# Patient Record
Sex: Female | Born: 1955 | Race: White | Hispanic: No | Marital: Married | Smoking: Former smoker
Health system: Southern US, Community
[De-identification: ages and names within clinical notes are randomized; demographics above are authoritative.]

## PROBLEM LIST (undated history)

## (undated) DIAGNOSIS — I4892 Unspecified atrial flutter: Secondary | ICD-10-CM

## (undated) DIAGNOSIS — K589 Irritable bowel syndrome without diarrhea: Secondary | ICD-10-CM

## (undated) DIAGNOSIS — E785 Hyperlipidemia, unspecified: Secondary | ICD-10-CM

## (undated) DIAGNOSIS — R011 Cardiac murmur, unspecified: Secondary | ICD-10-CM

## (undated) DIAGNOSIS — F329 Major depressive disorder, single episode, unspecified: Secondary | ICD-10-CM

## (undated) DIAGNOSIS — F419 Anxiety disorder, unspecified: Secondary | ICD-10-CM

## (undated) DIAGNOSIS — A498 Other bacterial infections of unspecified site: Secondary | ICD-10-CM

## (undated) DIAGNOSIS — I5189 Other ill-defined heart diseases: Secondary | ICD-10-CM

## (undated) DIAGNOSIS — K5792 Diverticulitis of intestine, part unspecified, without perforation or abscess without bleeding: Secondary | ICD-10-CM

## (undated) DIAGNOSIS — E7800 Pure hypercholesterolemia, unspecified: Secondary | ICD-10-CM

## (undated) DIAGNOSIS — K5790 Diverticulosis of intestine, part unspecified, without perforation or abscess without bleeding: Secondary | ICD-10-CM

## (undated) DIAGNOSIS — F32A Depression, unspecified: Secondary | ICD-10-CM

## (undated) DIAGNOSIS — M1990 Unspecified osteoarthritis, unspecified site: Secondary | ICD-10-CM

## (undated) DIAGNOSIS — K219 Gastro-esophageal reflux disease without esophagitis: Secondary | ICD-10-CM

## (undated) HISTORY — DX: Major depressive disorder, single episode, unspecified: F32.9

## (undated) HISTORY — PX: BREAST LUMPECTOMY: SHX2

## (undated) HISTORY — DX: Diverticulosis of intestine, part unspecified, without perforation or abscess without bleeding: K57.90

## (undated) HISTORY — DX: Hyperlipidemia, unspecified: E78.5

## (undated) HISTORY — DX: Depression, unspecified: F32.A

## (undated) HISTORY — DX: Cardiac murmur, unspecified: R01.1

## (undated) HISTORY — DX: Pure hypercholesterolemia, unspecified: E78.00

## (undated) HISTORY — PX: TUBAL LIGATION: SHX77

## (undated) HISTORY — DX: Gastro-esophageal reflux disease without esophagitis: K21.9

## (undated) HISTORY — DX: Unspecified atrial flutter: I48.92

## (undated) HISTORY — DX: Other ill-defined heart diseases: I51.89

## (undated) HISTORY — DX: Irritable bowel syndrome, unspecified: K58.9

## (undated) HISTORY — DX: Diverticulitis of intestine, part unspecified, without perforation or abscess without bleeding: K57.92

## (undated) HISTORY — PX: APPENDECTOMY: SHX54

## (undated) HISTORY — DX: Anxiety disorder, unspecified: F41.9

## (undated) HISTORY — DX: Other bacterial infections of unspecified site: A49.8

## (undated) HISTORY — DX: Unspecified osteoarthritis, unspecified site: M19.90

## (undated) HISTORY — PX: COLONOSCOPY: SHX174

---

## 1999-04-04 ENCOUNTER — Other Ambulatory Visit: Admission: RE | Admit: 1999-04-04 | Discharge: 1999-04-04 | Payer: Self-pay | Admitting: Obstetrics and Gynecology

## 2000-04-13 ENCOUNTER — Other Ambulatory Visit: Admission: RE | Admit: 2000-04-13 | Discharge: 2000-04-13 | Payer: Self-pay | Admitting: Obstetrics and Gynecology

## 2001-04-14 ENCOUNTER — Other Ambulatory Visit: Admission: RE | Admit: 2001-04-14 | Discharge: 2001-04-14 | Payer: Self-pay | Admitting: Obstetrics and Gynecology

## 2001-09-02 ENCOUNTER — Ambulatory Visit (HOSPITAL_BASED_OUTPATIENT_CLINIC_OR_DEPARTMENT_OTHER): Admission: RE | Admit: 2001-09-02 | Discharge: 2001-09-02 | Payer: Self-pay | Admitting: Surgery

## 2001-09-02 ENCOUNTER — Encounter (INDEPENDENT_AMBULATORY_CARE_PROVIDER_SITE_OTHER): Payer: Self-pay | Admitting: Specialist

## 2002-07-04 ENCOUNTER — Other Ambulatory Visit: Admission: RE | Admit: 2002-07-04 | Discharge: 2002-07-04 | Payer: Self-pay | Admitting: Obstetrics and Gynecology

## 2003-11-19 ENCOUNTER — Other Ambulatory Visit: Admission: RE | Admit: 2003-11-19 | Discharge: 2003-11-19 | Payer: Self-pay | Admitting: Obstetrics and Gynecology

## 2004-05-12 ENCOUNTER — Encounter: Admission: RE | Admit: 2004-05-12 | Discharge: 2004-05-12 | Payer: Self-pay | Admitting: Family Medicine

## 2004-06-09 ENCOUNTER — Encounter: Admission: RE | Admit: 2004-06-09 | Discharge: 2004-06-09 | Payer: Self-pay | Admitting: Family Medicine

## 2004-06-24 ENCOUNTER — Ambulatory Visit: Payer: Self-pay | Admitting: Gastroenterology

## 2004-06-25 ENCOUNTER — Ambulatory Visit (HOSPITAL_COMMUNITY): Admission: RE | Admit: 2004-06-25 | Discharge: 2004-06-25 | Payer: Self-pay | Admitting: Gastroenterology

## 2004-06-25 ENCOUNTER — Ambulatory Visit: Payer: Self-pay | Admitting: Gastroenterology

## 2004-07-10 ENCOUNTER — Ambulatory Visit: Payer: Self-pay | Admitting: Gastroenterology

## 2004-07-15 ENCOUNTER — Ambulatory Visit: Payer: Self-pay | Admitting: Cardiology

## 2004-08-20 ENCOUNTER — Ambulatory Visit: Payer: Self-pay | Admitting: Gastroenterology

## 2004-08-20 ENCOUNTER — Encounter (INDEPENDENT_AMBULATORY_CARE_PROVIDER_SITE_OTHER): Payer: Self-pay | Admitting: Specialist

## 2004-10-29 ENCOUNTER — Ambulatory Visit: Payer: Self-pay | Admitting: Gastroenterology

## 2004-12-25 ENCOUNTER — Other Ambulatory Visit: Admission: RE | Admit: 2004-12-25 | Discharge: 2004-12-25 | Payer: Self-pay | Admitting: Obstetrics and Gynecology

## 2007-01-17 ENCOUNTER — Encounter: Admission: RE | Admit: 2007-01-17 | Discharge: 2007-01-17 | Payer: Self-pay | Admitting: Family Medicine

## 2008-02-23 ENCOUNTER — Encounter: Admission: RE | Admit: 2008-02-23 | Discharge: 2008-02-23 | Payer: Self-pay | Admitting: Obstetrics and Gynecology

## 2008-08-31 ENCOUNTER — Encounter: Admission: RE | Admit: 2008-08-31 | Discharge: 2008-08-31 | Payer: Self-pay | Admitting: Family Medicine

## 2010-07-02 ENCOUNTER — Encounter (INDEPENDENT_AMBULATORY_CARE_PROVIDER_SITE_OTHER): Payer: Self-pay | Admitting: *Deleted

## 2010-07-08 NOTE — Letter (Signed)
Summary: Pre Visit Letter Revised  [facility]  [address] [place], [place] [address]   Phone: [phone]  Fax: [phone]        07/02/2010 MRN: [id number] [facility] [address] [place], [place]  [address]             Procedure Date:  08-18-10           Recall Colon----Dr. Jarold Motto   Welcome to the Gastroenterology Division at [facility].    You are scheduled to see a nurse for your pre-procedure visit on 08-04-10 at 1:30p.m. on the 3rd floor at [facility], [address]  We ask that you try to arrive at our office 15 minutes prior to your appointment time to allow for check-in.  Please take a minute to review the attached form.  If you answer "Yes" to one or more of the questions on the first page, we ask that you call the person listed at your earliest opportunity.  If you answer "No" to all of the questions, please complete the rest of the form and bring it to your appointment.    Your nurse visit will consist of discussing your medical and surgical history, your immediate family medical history, and your medications.   If you are unable to list all of your medications on the form, please bring the medication bottles to your appointment and we will list them.  We will need to be aware of both prescribed and over the counter drugs.  We will need to know exact dosage information as well.    Please be prepared to read and sign documents such as consent forms, a financial agreement, and acknowledgement forms.  If necessary, and with your consent, a friend or relative is welcome to sit-in on the nurse visit with you.  Please bring your insurance card so that we may make a copy of it.  If your insurance requires a referral to see a specialist, please bring your referral form from your primary care physician.  No co-pay is required for this nurse visit.     If you cannot keep your appointment, please call [phone] to cancel or reschedule  prior to your appointment date.  This allows [place] the opportunity to schedule an appointment for another patient in need of care.    Thank you for choosing [facility] for your medical needs.  We appreciate the opportunity to care for you.  Please visit [place] at our website  to learn more about our practice.  Sincerely, The Gastroenterology Division

## 2010-08-04 ENCOUNTER — Ambulatory Visit (AMBULATORY_SURGERY_CENTER): Payer: BC Managed Care – PPO | Admitting: *Deleted

## 2010-08-04 VITALS — Ht 68.0 in | Wt 165.6 lb

## 2010-08-04 DIAGNOSIS — Z1211 Encounter for screening for malignant neoplasm of colon: Secondary | ICD-10-CM

## 2010-08-04 DIAGNOSIS — Z800 Family history of malignant neoplasm of digestive organs: Secondary | ICD-10-CM

## 2010-08-04 MED ORDER — PEG-KCL-NACL-NASULF-NA ASC-C 100 G PO SOLR: 1.0000 | Freq: Once | ORAL | Status: AC

## 2010-08-04 NOTE — Patient Instructions (Signed)
Please pick up prep at pharmacy Please review prep instructions 

## 2010-08-06 ENCOUNTER — Encounter: Payer: Self-pay | Admitting: Gastroenterology

## 2010-08-15 ENCOUNTER — Encounter: Payer: Self-pay | Admitting: Gastroenterology

## 2010-08-18 ENCOUNTER — Ambulatory Visit (AMBULATORY_SURGERY_CENTER): Payer: BC Managed Care – PPO | Admitting: Gastroenterology

## 2010-08-18 ENCOUNTER — Encounter: Payer: Self-pay | Admitting: Gastroenterology

## 2010-08-18 DIAGNOSIS — Z139 Encounter for screening, unspecified: Secondary | ICD-10-CM

## 2010-08-18 DIAGNOSIS — K5730 Diverticulosis of large intestine without perforation or abscess without bleeding: Secondary | ICD-10-CM | POA: Insufficient documentation

## 2010-08-18 DIAGNOSIS — K5732 Diverticulitis of large intestine without perforation or abscess without bleeding: Secondary | ICD-10-CM

## 2010-08-18 DIAGNOSIS — Z1211 Encounter for screening for malignant neoplasm of colon: Secondary | ICD-10-CM

## 2010-08-18 DIAGNOSIS — D126 Benign neoplasm of colon, unspecified: Secondary | ICD-10-CM | POA: Insufficient documentation

## 2010-08-18 DIAGNOSIS — Z800 Family history of malignant neoplasm of digestive organs: Secondary | ICD-10-CM

## 2010-08-18 DIAGNOSIS — K648 Other hemorrhoids: Secondary | ICD-10-CM

## 2010-08-18 MED ORDER — CIPROFLOXACIN HCL 500 MG PO TABS: 500.0000 mg | ORAL_TABLET | Freq: Two times a day (BID) | ORAL | Status: AC

## 2010-08-18 MED ORDER — SODIUM CHLORIDE 0.9 % IV SOLN: 500.0000 mL | INTRAVENOUS | Status: DC

## 2010-08-18 NOTE — Patient Instructions (Signed)
REPEAT COLONOSCOPY IN 5 YEARS- WE WILL MAIL YOU A LETTER TO REMIND YOU  HANDOUTS GIVEN ON DIVERTICULOSIS AND LITIS, HIGH FIBER DIET, HEMORRHOIDS  START 500MG  TWICE A DAY FOR 7 DAYS TO TREAT YOUR DIVERTICULITIS, SENT TO [facility] AS REQUESTED  CAN ALSO TRY METAMUCIL OR BENEFIBER DAILY OVER THE COUNTER

## 2010-08-19 ENCOUNTER — Telehealth: Payer: Self-pay | Admitting: *Deleted

## 2010-08-19 NOTE — Telephone Encounter (Signed)

## 2010-08-22 NOTE — Op Note (Signed)
[place]  Patient:    Sheri Sullivan, Sheri Sullivan Visit Number: [id number] MRN: [id number]          Service Type: DSU Location: [facility] Attending Physician:  Charlton Haws Dictated by:   Currie Paris, M.D. Proc. Date: 09/02/01 Admit Date:  09/02/2001   CC:         Desma Maxim, M.D.  Malachi Pro. Ambrose Mantle, M.D.   Operative Report  CCS# [id number]  PREOPERATIVE DIAGNOSIS:  Right breast mass.  POSTOPERATIVE DIAGNOSIS:  Right breast mass.  OPERATION PERFORMED:  Excision of right breast mass.  SURGEON:  Currie Paris, M.D.  ANESTHESIA:  MAC.  INDICATIONS FOR PROCEDURE:  The patient has a right breast mass and irregularity, density in the upper outer quadrant of her right breast almost into the axilla.  It was not visible on mammography and ultrasound and had really been stable for a while but because of the patients concern, we elected to go ahead and remove this.  DESCRIPTION OF PROCEDURE:  The patient was seen in the holding area and had no further questions.  She was taken to the operating room and positioned with her arm above her head which was the position that this was most easily palpable in.  The mass was identified by the patient and myself as a very small soft mass almost out of the breast into the axillary tail area and the area was marked.  She was kept in that position and then IV sedation given. The breast was prepped and draped and anesthetized with 1% Xylocaine.  A curvilinear incision directly over this area was made and this area was fairly mobile and soft and I had to excise a little bit of subcutaneous fatty tissue until I could get down to the breast and with my finger palpating, I was able to trap the nodule and grasp some adjacent tissue with an Allis, free up a little bit of breast tissue with the cautery and then get the mass itself grasped with another Allis and excise it with a little bit of normal  breast tissue around it.  I think that the mass is completely removed and covered by normal fibrous breast tissue.  It measured perhaps 1 cm in size and I thought completely excised.  Further palpation revealed no additional abnormalities. Additional local had been infiltrated as we worked and I closed in layers with 3-0 Vicryl followed by 4-0 Monocryl subcuticular with Steri-Strips.  The patient tolerated the procedure well and there were no operative complications.  All counts were correct. Dictated by:   Currie Paris, M.D. Attending Physician:  Charlton Haws DD:  09/02/01 TD:  09/03/01 Job: [id number]

## 2010-09-03 ENCOUNTER — Encounter: Payer: Self-pay | Admitting: Gastroenterology

## 2011-02-04 ENCOUNTER — Other Ambulatory Visit: Payer: Self-pay | Admitting: Family Medicine

## 2011-02-06 ENCOUNTER — Ambulatory Visit
Admission: RE | Admit: 2011-02-06 | Discharge: 2011-02-06 | Disposition: A | Discharge status: Home / Self Care | Payer: BC Managed Care – PPO | Source: Ambulatory Visit | Attending: Family Medicine | Admitting: Family Medicine

## 2011-02-06 MED ORDER — IOHEXOL 300 MG/ML  SOLN
100.0000 mL | Freq: Once | INTRAMUSCULAR | Status: AC | PRN
  Administered 2011-02-06: 100 mL via INTRAVENOUS

## 2011-03-03 ENCOUNTER — Other Ambulatory Visit: Payer: Self-pay | Admitting: Obstetrics and Gynecology

## 2013-06-09 ENCOUNTER — Other Ambulatory Visit: Payer: Self-pay | Admitting: *Deleted

## 2013-06-09 DIAGNOSIS — R002 Palpitations: Secondary | ICD-10-CM

## 2013-06-13 ENCOUNTER — Encounter (INDEPENDENT_AMBULATORY_CARE_PROVIDER_SITE_OTHER): Payer: PRIVATE HEALTH INSURANCE

## 2013-06-13 ENCOUNTER — Encounter: Payer: Self-pay | Admitting: *Deleted

## 2013-06-13 ENCOUNTER — Other Ambulatory Visit: Payer: Self-pay | Admitting: Obstetrics and Gynecology

## 2013-06-13 DIAGNOSIS — R002 Palpitations: Secondary | ICD-10-CM

## 2013-06-13 NOTE — Progress Notes (Signed)
Patient ID: Sheri Sullivan, female   DOB: 20-Apr-1955, 58 y.o.   MRN: [id number] E-Cardio verite 30 day cardiac event monitor applied to patient.

## 2013-07-24 ENCOUNTER — Telehealth: Payer: Self-pay | Admitting: *Deleted

## 2013-07-24 NOTE — Telephone Encounter (Signed)
Left message with front desk attendant about monitor results. (she tells me dr is in pt room) Informed her that event monitor showed paroxysmal flutter w/ increased rate, per Dr. Johnsie Cancel. I will give back to monitor room to fax over copy.

## 2013-12-20 ENCOUNTER — Other Ambulatory Visit (HOSPITAL_COMMUNITY): Payer: Self-pay | Admitting: Family Medicine

## 2013-12-20 DIAGNOSIS — I471 Supraventricular tachycardia: Secondary | ICD-10-CM

## 2013-12-21 ENCOUNTER — Ambulatory Visit (HOSPITAL_COMMUNITY): Payer: PRIVATE HEALTH INSURANCE | Attending: Family Medicine

## 2013-12-21 DIAGNOSIS — Z87891 Personal history of nicotine dependence: Secondary | ICD-10-CM | POA: Insufficient documentation

## 2013-12-21 DIAGNOSIS — I4891 Unspecified atrial fibrillation: Secondary | ICD-10-CM | POA: Insufficient documentation

## 2013-12-21 DIAGNOSIS — E7800 Pure hypercholesterolemia, unspecified: Secondary | ICD-10-CM | POA: Insufficient documentation

## 2013-12-21 DIAGNOSIS — I471 Supraventricular tachycardia: Secondary | ICD-10-CM

## 2013-12-21 NOTE — Progress Notes (Signed)
2D Echo completed. 12/21/2013

## 2014-01-09 ENCOUNTER — Encounter: Payer: Self-pay | Admitting: Cardiovascular Disease

## 2014-01-09 ENCOUNTER — Ambulatory Visit (INDEPENDENT_AMBULATORY_CARE_PROVIDER_SITE_OTHER): Payer: PRIVATE HEALTH INSURANCE | Admitting: Cardiovascular Disease

## 2014-01-09 VITALS — BP 90/70 | HR 73 | Ht 68.0 in | Wt 157.0 lb

## 2014-01-09 DIAGNOSIS — I471 Supraventricular tachycardia: Secondary | ICD-10-CM

## 2014-01-09 NOTE — Progress Notes (Signed)
Sheri Sullivan Date of Birth  12-Aug-1955       [facility] Office [address], [place], [place]  [address]   [place], [place]  [address] [place]   Fax  [phone]     Fax [phone]  Problem List: 1. Supraventricular tachycardia vs. paroxysmal atrial flutter with rapid ventricular rate 2. Attending   History of Present Illness:  Sheri Sullivan is a 58 yo who is referred for episodes of PSVT / atrial flutter with RVR/. These started a year ago. Lots of stress starting last yest - buy at church [facility]).  Daughter got married. Dr. Brigitte Pulse started metoprolol  But dose is limited by hypotension.   The episodes last about 30 seconds.  Not associated with exercise.  Slight shortness or breath.  No diaphoresis. Does not really slow her down much.    Exercises - walks 2 miles a day.     Current Outpatient Prescriptions on File Prior to Visit  Medication Sig Dispense Refill  . Calcium Carbonate-Vitamin D (CALCIUM + D PO) Take 1 tablet by mouth 2 (two) times daily.        Marland Kitchen FIBER PO Take 2 tablets by mouth daily.        . IBUPROFEN PO Take by mouth as needed.        . multivitamin (THERAGRAN) per tablet Take 1 tablet by mouth daily.        . Omega-3 Fatty Acids (FISH OIL) 1000 MG CAPS Take 1,000 mg by mouth 2 (two) times daily.         No current facility-administered medications on file prior to visit.    Allergies  Allergen Reactions  . Codeine     Makes pt nervous and shaky    Past Medical History  Diagnosis Date  . Arthritis   . Hyperlipidemia   . Osteoporosis     osteopenia    Past Surgical History  Procedure Laterality Date  . Colonoscopy    . Appendectomy    . Tubal ligation    . Breast lumpectomy      R breast, benign lump    History  Smoking status  . Former Smoker  Smokeless tobacco  . Not on file    History  Alcohol Use  . 4.8 oz/week  . 8  Glasses of wine per week    Family History  Problem Relation Age of Onset  . Colon cancer Mother     Reviw of Systems:  Reviewed in the HPI.  All other systems are negative.  Physical Exam: Blood pressure 90/70, pulse 73, height 5\' 8"  (1.727 m), weight 157 lb (71.215 kg). Wt Readings from Last 3 Encounters:  01/09/14 157 lb (71.215 kg)  08/04/10 165 lb 9.6 oz (75.116 kg)     General: Well developed, well nourished, in no acute distress.  Head: Normocephalic, atraumatic, sclera non-icteric, mucus membranes are moist,   Neck: Supple. Carotids are 2 + without bruits. No JVD   Lungs: Clear   Heart: RR, very soft murmur, no click heart  Abdomen: Soft, non-tender, non-distended with normal bowel sounds.  Msk:  Strength and tone are normal   Extremities: No clubbing or cyanosis. No edema.  Distal pedal pulses are 2+ and equal    Neuro: CN II - XII intact.  Alert and oriented X 3.   Psych:  Normal  ECG: Oct. 6, 2015:  NSR at 73.  Normal   Assessment / Plan:

## 2014-01-09 NOTE — Patient Instructions (Addendum)
You  probably have supra-ventricular tachycardia. Remember to try a Valsalva maneuver and the diving reflex if you have prolonged episodes  Your physician wants you to follow-up in: 6 months with Dr. Acie Fredrickson.  You will receive a reminder letter in the mail two months in advance. If you don't receive a letter, please call our office to schedule the follow-up appointment.

## 2014-01-09 NOTE — Assessment & Plan Note (Signed)
Sheri Sullivan presents for further evaluation of episodes of tachycardia. She wore an event monitor and the strips recorded. The consistent with supraventricular tachycardia. I cannot completely rule out atrial flutter with a rapid ventricular rate.  He has been started on metoprolol 25 mg twice a day which has helped with the SVT but her blood pressure is low and she doesn't tolerate any higher doses of metoprolol.  We discussed the 3 W. to terminate SVT- Valsalva maneuver, diving reflex, and carotid sinus massage  At this point her episodes only last for about 30 seconds and really not much of a nuisance. We discussed the fact that they really are not going to be any significant risk associated with these.  We discussed RF ablation - she doe not need to consider that yet.

## 2014-01-12 ENCOUNTER — Encounter: Payer: Self-pay | Admitting: Cardiovascular Disease

## 2014-06-01 ENCOUNTER — Encounter: Payer: Self-pay | Admitting: Gastroenterology

## 2014-06-06 ENCOUNTER — Encounter: Payer: Self-pay | Admitting: Gastroenterology

## 2014-06-06 ENCOUNTER — Other Ambulatory Visit (INDEPENDENT_AMBULATORY_CARE_PROVIDER_SITE_OTHER): Payer: PRIVATE HEALTH INSURANCE

## 2014-06-06 ENCOUNTER — Other Ambulatory Visit: Payer: Self-pay

## 2014-06-06 ENCOUNTER — Ambulatory Visit (INDEPENDENT_AMBULATORY_CARE_PROVIDER_SITE_OTHER): Payer: PRIVATE HEALTH INSURANCE | Admitting: Gastroenterology

## 2014-06-06 VITALS — BP 105/60 | HR 59 | Ht 68.0 in | Wt 165.2 lb

## 2014-06-06 DIAGNOSIS — R1033 Periumbilical pain: Secondary | ICD-10-CM

## 2014-06-06 DIAGNOSIS — K921 Melena: Secondary | ICD-10-CM

## 2014-06-06 DIAGNOSIS — K5901 Slow transit constipation: Secondary | ICD-10-CM

## 2014-06-06 DIAGNOSIS — D509 Iron deficiency anemia, unspecified: Secondary | ICD-10-CM

## 2014-06-06 LAB — COMPREHENSIVE METABOLIC PANEL
ALT: 16 U/L (ref 0–35)
AST: 20 U/L (ref 0–37)
Albumin: 4.4 g/dL (ref 3.5–5.2)
Alkaline Phosphatase: 51 U/L (ref 39–117)
BUN: 15 mg/dL (ref 6–23)
CALCIUM: 10.3 mg/dL (ref 8.4–10.5)
CHLORIDE: 105 meq/L (ref 96–112)
CO2: 28 meq/L (ref 19–32)
CREATININE: 0.83 mg/dL (ref 0.40–1.20)
GFR: 75.19 mL/min (ref >60.00)
Glucose, Bld: 96 mg/dL (ref 70–99)
Potassium: 4.1 mEq/L (ref 3.5–5.1)
Sodium: 140 mEq/L (ref 135–145)
Total Bilirubin: 0.6 mg/dL (ref 0.2–1.2)
Total Protein: 7.2 g/dL (ref 6.0–8.3)

## 2014-06-06 LAB — IBC PANEL
IRON: 117 ug/dL (ref 42–145)
SATURATION RATIOS: 31.2 % (ref 20.0–50.0)
TRANSFERRIN: 268 mg/dL (ref 212.0–360.0)

## 2014-06-06 LAB — VITAMIN B12: Vitamin B-12: 532 pg/mL (ref 211–911)

## 2014-06-06 LAB — FERRITIN: Ferritin: 12.2 ng/mL (ref 10.0–291.0)

## 2014-06-06 LAB — FOLATE: Folate: >24.6 ng/mL (ref >5.9)

## 2014-06-06 MED ORDER — IRON 325 (65 FE) MG PO TABS: 325.0000 mg | ORAL_TABLET | Freq: Two times a day (BID) | ORAL | Status: DC

## 2014-06-06 MED ORDER — CIPROFLOXACIN HCL 500 MG PO TABS: 500.0000 mg | ORAL_TABLET | Freq: Two times a day (BID) | ORAL | Status: DC

## 2014-06-06 NOTE — Progress Notes (Signed)
History of Present Illness: This is a 59 year old female referred by Dr. Kelton Pillar for evaluation constipation and rectal bleeding. She underwent colonoscopy by Dr. Sharlett Iles in May 2012. Diverticulosis with focal diverticulitis, internal hemorrhoids and a small hyperplastic polyp were noted. The colon was tortuous and redundant. Patient has had worsening constipation over the past year and she now uses a fiber supplement and MiraLAX on a daily basis without complete relief of symptoms. In February she had 2 weeks of bright red blood per rectum with bowel movements that subsided after a course of Preparation H suppositories. She was recently evaluated by her PCP with hemorrhoids noted on rectal exam. No bleeding noted. Her hemoglobin was in the low normal range at 12.5. Prior hemoglobin was 14.3. She has been fatigued.   Allergies  Allergen Reactions  . Codeine     Makes pt nervous and shaky   Outpatient Prescriptions Prior to Visit  Medication Sig Dispense Refill  . aspirin 81 MG tablet Take 81 mg by mouth daily.    . Calcium Carbonate-Vitamin D (CALCIUM + D PO) Take 1 tablet by mouth 2 (two) times daily.      Marland Kitchen FIBER PO Take 2 tablets by mouth daily.      . IBUPROFEN PO Take by mouth as needed.      . multivitamin (THERAGRAN) per tablet Take 1 tablet by mouth daily.      . Omega-3 Fatty Acids (FISH OIL) 1000 MG CAPS Take 1,000 mg by mouth 2 (two) times daily.      . metoprolol tartrate (LOPRESSOR) 25 MG tablet Take 12.5 mg by mouth daily.     No facility-administered medications prior to visit.   Past Medical History  Diagnosis Date  . Arthritis   . Hyperlipidemia   . Osteoporosis     osteopenia  . Diverticulitis   . Diverticulosis   . IBS (irritable bowel syndrome)   . Depression   . Paroxysmal atrial flutter    Past Surgical History  Procedure Laterality Date  . Colonoscopy    . Appendectomy    . Tubal ligation    . Breast lumpectomy      R breast, benign lump    History   Social History  . Marital Status: Married    Spouse Name: N/A  . Number of Children: N/A  . Years of Education: N/A   Social History Main Topics  . Smoking status: Former Research scientist (life sciences)  . Smokeless tobacco: Not on file  . Alcohol Use: 4.8 oz/week    8 Glasses of wine per week  . Drug Use: No  . Sexual Activity: Not on file   Other Topics Concern  . None   Social History Narrative   Family History  Problem Relation Age of Onset  . Colon cancer Mother      Review of Systems: Pertinent positive and negative review of systems were noted in the above HPI section. All other review of systems were otherwise negative.   Physical Exam: General: Well developed, well nourished, no acute distress Head: Normocephalic and atraumatic Eyes:  sclerae anicteric, EOMI Ears: Normal auditory acuity Mouth: No deformity or lesions Neck: Supple, no masses or thyromegaly Lungs: Clear throughout to auscultation Heart: Regular rate and rhythm; no murmurs, rubs or bruits Abdomen: Soft, mild periumbilical abdominal tenderness and non distended. No masses, hepatosplenomegaly or hernias noted. Normal Bowel sounds Rectal: deferred with recent exam by PCP showing hemorrhoids, no blood Musculoskeletal: Symmetrical with no gross deformities  Skin: No lesions on visible extremities Pulses:  Normal pulses noted Extremities: No clubbing, cyanosis, edema or deformities noted Neurological: Alert oriented x 4, grossly nonfocal Cervical Nodes:  No significant cervical adenopathy Inguinal Nodes: No significant inguinal adenopathy Psychological:  Alert and cooperative. Normal mood and affect  Assessment and Recommendations:  1. Hematochezia. Likely secondary to hemorrhoids. If bleeding recurs will consider evaluation with colonoscopy. Preparation H suppositories as needed.  2. Constipation and periumbilical abdominal pain. Increase MiraLAX to twice daily. Possible diverticulitis and will treat with a  7 day course of Cipro. If symptoms persist consider abdominal/pelvic CT. REV in 3-4 weeks.  3. Decrease in hemoglobin. Obtain iron, TIBC B12 and folate.  4. Family history of colon cancer. Higher risk screening colonoscopy due in May 2017.  cc: Kelton Pillar, MD [address] [place], [place] [address]

## 2014-06-06 NOTE — Patient Instructions (Addendum)
Your physician has requested that you go to the basement for lab work before leaving today.  Increase your Miralax to twice daily.  We have sent the following medications to your pharmacy for you to pick up at your convenience: Cipro.  Your follow up appointment with Dr. Fuller Plan is scheduled for 06/27/14 at 10:30am. If you need to reschedule or cancel please call [phone].

## 2014-06-20 ENCOUNTER — Other Ambulatory Visit: Payer: PRIVATE HEALTH INSURANCE

## 2014-06-20 DIAGNOSIS — D509 Iron deficiency anemia, unspecified: Secondary | ICD-10-CM

## 2014-06-20 LAB — HEMOCCULT SLIDES (X 3 CARDS)
Fecal Occult Blood: NEGATIVE
OCCULT 1: NEGATIVE
OCCULT 2: NEGATIVE
OCCULT 3: NEGATIVE
OCCULT 4: NEGATIVE
OCCULT 5: NEGATIVE

## 2014-06-27 ENCOUNTER — Ambulatory Visit (INDEPENDENT_AMBULATORY_CARE_PROVIDER_SITE_OTHER): Payer: PRIVATE HEALTH INSURANCE | Admitting: Gastroenterology

## 2014-06-27 ENCOUNTER — Encounter: Payer: Self-pay | Admitting: Gastroenterology

## 2014-06-27 VITALS — BP 112/64 | HR 60 | Ht 68.0 in | Wt 164.2 lb

## 2014-06-27 DIAGNOSIS — R109 Unspecified abdominal pain: Secondary | ICD-10-CM

## 2014-06-27 DIAGNOSIS — K5900 Constipation, unspecified: Secondary | ICD-10-CM

## 2014-06-27 MED ORDER — LINACLOTIDE 145 MCG PO CAPS: 145.0000 ug | ORAL_CAPSULE | Freq: Every day | ORAL | Status: DC

## 2014-06-27 NOTE — Patient Instructions (Signed)
We have sent the following medications to your pharmacy for you to pick up at your convenience:Linzess.  You have been scheduled for a CT scan of the abdomen and pelvis at [facility] ([address]---this is in the same building as [facility]).   You are scheduled on 07/09/14 at 1:30pm. You should arrive 15 minutes prior to your appointment time for registration. Please follow the written instructions below on the day of your exam:  WARNING: IF YOU ARE ALLERGIC TO IODINE/X-RAY DYE, PLEASE NOTIFY RADIOLOGY IMMEDIATELY AT [phone]! YOU WILL BE GIVEN A 13 HOUR PREMEDICATION PREP.  1) Do not eat or drink anything after 9:30am (4 hours prior to your test) 2) You have been given 2 bottles of oral contrast to drink. The solution may taste better if refrigerated, but do NOT add ice or any other liquid to this solution. Shake well before drinking.    Drink 1 bottle of contrast @ 11:30am (2 hours prior to your exam)  Drink 1 bottle of contrast @ 12:30pm (1 hour prior to your exam)  You may take any medications as prescribed with a small amount of water except for the following: Metformin, Glucophage, Glucovance, Avandamet, Riomet, Fortamet, Actoplus Met, Janumet, Glumetza or Metaglip. The above medications must be held the day of the exam AND 48 hours after the exam.  The purpose of you drinking the oral contrast is to aid in the visualization of your intestinal tract. The contrast solution may cause some diarrhea. Before your exam is started, you will be given a small amount of fluid to drink. Depending on your individual set of symptoms, you may also receive an intravenous injection of x-ray contrast/dye. Plan on being at [facility] for 30 minutes or long, depending on the type of exam you are having performed.  This test typically takes 30-45 minutes to complete.  If you have any questions regarding your exam or if you need to reschedule, you may call the CT department  at [phone] between the hours of 8:00 am and 5:00 pm, Monday-Friday.  ________________________________________________________________________ Your follow up appointment with Dr. Fuller Plan is on 08/10/14 at 9:15am.   Thank you for choosing me and [facility].  Pricilla Riffle. Dagoberto Ligas., MD., [facility]

## 2014-06-27 NOTE — Progress Notes (Signed)
    History of Present Illness: This is a 59 year old female returning for follow-up of right-sided abdominal pain and constipation. She completed a course of antibiotics and states that for 2-3 days after finishing antibiotics her symptoms were improved however they have returned. She notes it MiraLAX twice a day is not adequately addressing her constipation. Her right side abdominal pain is intermittent and often relieved with a bowel movement.  Current Medications, Allergies, Past Medical History, Past Surgical History, Family History and Social History were reviewed in [employer] record.  Physical Exam: General: Well developed , well nourished, no acute distress Head: Normocephalic and atraumatic Eyes:  sclerae anicteric, EOMI Ears: Normal auditory acuity Mouth: No deformity or lesions Lungs: Clear throughout to auscultation Heart: Regular rate and rhythm; no murmurs, rubs or bruits Abdomen: Soft, mild right mid abdominal tenderness and non distended. No masses, hepatosplenomegaly or hernias noted. Normal Bowel sounds Musculoskeletal: Symmetrical with no gross deformities  Pulses:  Normal pulses noted Extremities: No clubbing, cyanosis, edema or deformities noted Neurological: Alert oriented x 4, grossly nonfocal Psychological:  Alert and cooperative. Normal mood and affect  Assessment and Recommendations:   1. Right-sided abdominal pain and constipation. I suspect this is IBS-C however need to exclude other disorders. Schedule abdominal/pelvic CT. Trial of Linzess 145 g daily. Return office visit in 6 weeks.

## 2014-07-09 ENCOUNTER — Ambulatory Visit (INDEPENDENT_AMBULATORY_CARE_PROVIDER_SITE_OTHER)
Admission: RE | Admit: 2014-07-09 | Discharge: 2014-07-09 | Disposition: A | Discharge status: Home / Self Care | Payer: PRIVATE HEALTH INSURANCE | Source: Ambulatory Visit | Attending: Gastroenterology | Admitting: Gastroenterology

## 2014-07-09 DIAGNOSIS — K5900 Constipation, unspecified: Secondary | ICD-10-CM | POA: Diagnosis not present

## 2014-07-09 DIAGNOSIS — R109 Unspecified abdominal pain: Secondary | ICD-10-CM | POA: Diagnosis not present

## 2014-07-09 MED ORDER — IOHEXOL 300 MG/ML  SOLN
100.0000 mL | Freq: Once | INTRAMUSCULAR | Status: AC | PRN
  Administered 2014-07-09: 100 mL via INTRAVENOUS

## 2014-07-16 ENCOUNTER — Ambulatory Visit: Payer: PRIVATE HEALTH INSURANCE | Admitting: Cardiovascular Disease

## 2014-07-26 ENCOUNTER — Encounter: Payer: Self-pay | Admitting: Gastroenterology

## 2014-08-03 ENCOUNTER — Encounter: Payer: Self-pay | Admitting: Cardiovascular Disease

## 2014-08-03 ENCOUNTER — Ambulatory Visit (INDEPENDENT_AMBULATORY_CARE_PROVIDER_SITE_OTHER): Payer: PRIVATE HEALTH INSURANCE | Admitting: Cardiovascular Disease

## 2014-08-03 VITALS — BP 102/60 | HR 62 | Ht 68.0 in | Wt 166.6 lb

## 2014-08-03 DIAGNOSIS — I471 Supraventricular tachycardia, unspecified: Secondary | ICD-10-CM

## 2014-08-03 MED ORDER — METOPROLOL SUCCINATE ER 25 MG PO TB24: 25.0000 mg | ORAL_TABLET | Freq: Every day | ORAL | Status: DC

## 2014-08-03 NOTE — Progress Notes (Signed)
Cardiology Office Note   Date:  08/03/2014   ID:  PICCOLA ARICO, DOB [birth year], MRN [id number]  PCP:  Mayra Neer, MD  Cardiologist:   Thayer Headings, MD   No chief complaint on file.  1. Supraventricular tachycardia vs. paroxysmal atrial flutter with rapid ventricular rate 2.    History of Present Illness:  Crossett is a 59 yo who is referred for episodes of PSVT / atrial flutter with RVR/. These started a year ago. Lots of stress starting last yest - buy at church [facility]). Daughter got married. Dr. Brigitte Pulse started metoprolol But dose is limited by hypotension.   The episodes last about 30 seconds. Not associated with exercise. Slight shortness or breath. No diaphoresis. Does not really slow her down much.   Exercises - walks 2 miles a day.    August 03, 2014: : SHAKIYLA KOOK is a 59 y.o. female who presents for palpitations She still has palpitations.   Does the valsalva.  Occasionally puts ice to her face.  Still has frequent episodes of SVT.     Past Medical History  Diagnosis Date  . Arthritis   . Hyperlipidemia   . Osteoporosis     osteopenia  . Diverticulitis   . Diverticulosis   . IBS (irritable bowel syndrome)   . Depression   . Paroxysmal atrial flutter     Past Surgical History  Procedure Laterality Date  . Colonoscopy    . Appendectomy    . Tubal ligation    . Breast lumpectomy      R breast, benign lump     Current Outpatient Prescriptions  Medication Sig Dispense Refill  . aspirin 81 MG tablet Take 81 mg by mouth daily.    . Calcium Carbonate-Vitamin D (CALCIUM + D PO) Take 1 tablet by mouth 2 (two) times daily.      Marland Kitchen FIBER PO Take 2 tablets by mouth 2 (two) times daily.     . IBUPROFEN PO Take by mouth as needed (AS NEEDED FOR MILD PAIN OR HEADACHE).     Marland Kitchen lactobacillus acidophilus (BACID) TABS tablet Take 1 tablet by mouth daily.    . Linaclotide (LINZESS) 145 MCG CAPS capsule Take 1 capsule (145 mcg  total) by mouth daily. 30 capsule 11  . metoprolol tartrate (LOPRESSOR) 25 MG tablet Take 12.5 mg by mouth daily. 1/2    . multivitamin (THERAGRAN) per tablet Take 1 tablet by mouth daily.      . Omega-3 Fatty Acids (FISH OIL) 1000 MG CAPS Take 1,000 mg by mouth 2 (two) times daily.      . Polyethylene Glycol 3350 (MIRALAX PO) Take by mouth daily.      No current facility-administered medications for this visit.    Allergies:   Codeine    Social History:  The patient  reports that she has quit smoking. She does not have any smokeless tobacco history on file. She reports that she drinks about 4.8 oz of alcohol per week. She reports that she does not use illicit drugs.   Family History:  The patient's family history includes Colon cancer in her mother.    ROS:  Please see the history of present illness.    Review of Systems: Constitutional:  denies fever, chills, diaphoresis, appetite change and fatigue.  HEENT: denies photophobia, eye pain, redness, hearing loss, ear pain, congestion, sore throat, rhinorrhea, sneezing, neck pain, neck stiffness and tinnitus.  Respiratory: denies SOB, DOE, cough, chest tightness, and  wheezing.  Cardiovascular: denies chest pain, palpitations and leg swelling.  Gastrointestinal: denies nausea, vomiting, abdominal pain, diarrhea, constipation, blood in stool.  Genitourinary: denies dysuria, urgency, frequency, hematuria, flank pain and difficulty urinating.  Musculoskeletal: denies  myalgias, back pain, joint swelling, arthralgias and gait problem.   Skin: denies pallor, rash and wound.  Neurological: denies dizziness, seizures, syncope, weakness, light-headedness, numbness and headaches.   Hematological: denies adenopathy, easy bruising, personal or family bleeding history.  Psychiatric/ Behavioral: denies suicidal ideation, mood changes, confusion, nervousness, sleep disturbance and agitation.       All other systems are reviewed and negative.     PHYSICAL EXAM: VS:  BP 102/60 mmHg  Pulse 62  Ht 5\' 8"  (1.727 m)  Wt 166 lb 9.6 oz (75.569 kg)  BMI 25.34 kg/m2 , BMI Body mass index is 25.34 kg/(m^2). GEN: Well nourished, well developed, in no acute distress HEENT: normal Neck: no JVD, carotid bruits, or masses Cardiac: RRR; no murmurs, rubs, or gallops,no edema  Respiratory:  clear to auscultation bilaterally, normal work of breathing GI: soft, nontender, nondistended, + BS MS: no deformity or atrophy Skin: warm and dry, no rash Neuro:  Strength and sensation are intact Psych: normal   EKG:  EKG is not ordered today. The ekg ordered today demonstrates    Recent Labs: 06/06/2014: ALT 16; BUN 15; Creatinine 0.83; Potassium 4.1; Sodium 140    Lipid Panel No results found for: CHOL, TRIG, HDL, CHOLHDL, VLDL, LDLCALC, LDLDIRECT    Wt Readings from Last 3 Encounters:  08/03/14 166 lb 9.6 oz (75.569 kg)  06/27/14 164 lb 4 oz (74.503 kg)  06/06/14 165 lb 3.2 oz (74.934 kg)      Other studies Reviewed: Additional studies/ records that were reviewed today include: . Review of the above records demonstrates:    ASSESSMENT AND PLAN:  1.  Supraventricular tachycardia: The patient presents with persistent SVT. She's found that she's able to convert her heart back to normal with a Valsalva maneuver. At this point it still remains a nuisance and has not really helped hazard. We'll try her on Toprol-XL 25 mg a day to see if this will help prevent some of the episodes. Her heart rate and blood pressure are already at the low end of normal so I do not  think that we have much room to increase the dose.   I'll see her back in the office in 6 months for follow-up visit.  Current medicines are reviewed at length with the patient today.  The patient does not have concerns regarding medicines.  The following changes have been made:  no change  Labs/ tests ordered today include:  No orders of the defined types were placed in this  encounter.     Disposition:   FU with me in 6 months      Mariaelena Cade, Wonda Cheng, MD  08/03/2014 4:53 PM    [facility] [place], [place], [place]  [address] Phone: [phone]; Fax: [phone]   [facility] ([place]  [address] [place], [place]  [address] [phone]    Fax [phone]

## 2014-08-03 NOTE — Patient Instructions (Signed)
Medication Instructions:  STOP Lopressor START Toprol XL 25 mg once daily  Labwork: None  Testing/Procedures: None  Follow-Up: Your physician wants you to follow-up in: 6 months with Dr. Acie Fredrickson.  You will receive a reminder letter in the mail two months in advance. If you don't receive a letter, please call our office to schedule the follow-up appointment.

## 2014-08-07 ENCOUNTER — Telehealth: Payer: Self-pay

## 2014-08-07 NOTE — Telephone Encounter (Signed)
Call the patient to let her know that if she takes 12.5 mg that she need to take it twice a day or if she wanted to take 25 mg she could take it once a day. She states that she would take 12.5 mg twice a day. She verbally understood

## 2014-08-07 NOTE — Telephone Encounter (Signed)
Ok thanks she will stay on the 12.5 mg

## 2014-08-07 NOTE — Telephone Encounter (Signed)
The med list at the time of the office visit indicated that she was on Metoprolol tartrate 12. 5 bid.  It appears that she was on metoprolol succinate instead.  She may continue the succinate - either 25 QD or 12. 5 BID , her choice

## 2014-08-10 ENCOUNTER — Ambulatory Visit (INDEPENDENT_AMBULATORY_CARE_PROVIDER_SITE_OTHER): Payer: PRIVATE HEALTH INSURANCE | Admitting: Gastroenterology

## 2014-08-10 ENCOUNTER — Encounter: Payer: Self-pay | Admitting: Gastroenterology

## 2014-08-10 VITALS — BP 98/58 | HR 68 | Ht 68.0 in | Wt 164.4 lb

## 2014-08-10 DIAGNOSIS — K589 Irritable bowel syndrome without diarrhea: Secondary | ICD-10-CM

## 2014-08-10 DIAGNOSIS — K5900 Constipation, unspecified: Secondary | ICD-10-CM | POA: Diagnosis not present

## 2014-08-10 DIAGNOSIS — Z800 Family history of malignant neoplasm of digestive organs: Secondary | ICD-10-CM | POA: Diagnosis not present

## 2014-08-10 NOTE — Progress Notes (Signed)
    History of Present Illness: This is a 59 year old female returning for follow-up of abdominal pain and constipation. An abd/pelvic CT on 07/09/14 was negative. Linzess is helping with IBS-C symptoms. Her abdominal pain has resolved. In addition to Banks she uses MiraLAX frequently and this has adequately addressed her constipation.  Current Medications, Allergies, Past Medical History, Past Surgical History, Family History and Social History were reviewed in [employer] record.  Physical Exam: General: Well developed , well nourished, no acute distress Head: Normocephalic and atraumatic Eyes:  sclerae anicteric, EOMI Ears: Normal auditory acuity Mouth: No deformity or lesions Lungs: Clear throughout to auscultation Heart: Regular rate and rhythm; no murmurs, rubs or bruits Abdomen: Soft, non tender and non distended. No masses, hepatosplenomegaly or hernias noted. Normal Bowel sounds Musculoskeletal: Symmetrical with no gross deformities  Pulses:  Normal pulses noted Extremities: No clubbing, cyanosis, edema or deformities noted Neurological: Alert oriented x 4, grossly nonfocal Psychological:  Alert and cooperative. Normal mood and affect  Assessment and Recommendations:  1. Right-sided abdominal pain and constipation secondary to IBS-C. Continue Linzess 145 g daily MiraLAX daily as needed. Return office visit in 1 year.  2. Family history of colon cancer in her mother. 5 year interval colonoscopy due in May 2017.  15 minutes of face-to-face time with the patient. Greater than 50% of the time was spent counseling and coordinating care.

## 2014-08-10 NOTE — Patient Instructions (Signed)
Please follow up with Dr. Fuller Plan in a year.  You are in our system for a colon recall for May 2017.  We will notify you.    I appreciate the opportunity to care for you.

## 2015-04-07 DIAGNOSIS — Z860100 Personal history of colon polyps, unspecified: Secondary | ICD-10-CM

## 2015-04-07 HISTORY — DX: Personal history of colonic polyps: Z86.010

## 2015-04-07 HISTORY — DX: Personal history of colon polyps, unspecified: Z86.0100

## 2015-06-13 ENCOUNTER — Encounter: Payer: Self-pay | Admitting: Gastroenterology

## 2015-08-09 ENCOUNTER — Telehealth: Payer: Self-pay | Admitting: Gastroenterology

## 2015-08-09 NOTE — Telephone Encounter (Signed)
OK with me.

## 2015-08-09 NOTE — Telephone Encounter (Signed)
Dr. Havery Moros would you okay the switch?

## 2015-08-11 NOTE — Telephone Encounter (Signed)
Only if okay with Dr. Fuller Plan. Please schedule for next routine office appointment. Thanks

## 2015-08-12 NOTE — Telephone Encounter (Signed)
Spoke with patient, she is driving and will call back to schedule

## 2015-08-21 ENCOUNTER — Ambulatory Visit (AMBULATORY_SURGERY_CENTER): Payer: Self-pay

## 2015-08-21 VITALS — Ht 68.0 in | Wt 173.0 lb

## 2015-08-21 DIAGNOSIS — Z860100 Personal history of colon polyps, unspecified: Secondary | ICD-10-CM

## 2015-08-21 MED ORDER — SUPREP BOWEL PREP KIT 17.5-3.13-1.6 GM/177ML PO SOLN: 1.0000 | Freq: Once | ORAL | Status: DC

## 2015-08-21 NOTE — Progress Notes (Signed)
No allergies to eggs or soy No diet meds No home oxygen No past problems with anesthesia  Has email and internet; declined emmi 

## 2015-08-28 ENCOUNTER — Encounter: Payer: Self-pay | Admitting: Gastroenterology

## 2015-09-05 ENCOUNTER — Ambulatory Visit (AMBULATORY_SURGERY_CENTER): Payer: PRIVATE HEALTH INSURANCE | Admitting: Gastroenterology

## 2015-09-05 ENCOUNTER — Encounter: Payer: Self-pay | Admitting: Gastroenterology

## 2015-09-05 VITALS — BP 105/57 | HR 58 | Temp 98.9°F | Resp 10 | Ht 68.0 in | Wt 173.0 lb

## 2015-09-05 DIAGNOSIS — D123 Benign neoplasm of transverse colon: Secondary | ICD-10-CM | POA: Diagnosis not present

## 2015-09-05 DIAGNOSIS — Z86010 Personal history of colonic polyps: Secondary | ICD-10-CM | POA: Diagnosis present

## 2015-09-05 DIAGNOSIS — Z800 Family history of malignant neoplasm of digestive organs: Secondary | ICD-10-CM

## 2015-09-05 DIAGNOSIS — D129 Benign neoplasm of anus and anal canal: Secondary | ICD-10-CM

## 2015-09-05 DIAGNOSIS — D128 Benign neoplasm of rectum: Secondary | ICD-10-CM | POA: Diagnosis not present

## 2015-09-05 DIAGNOSIS — D120 Benign neoplasm of cecum: Secondary | ICD-10-CM | POA: Diagnosis not present

## 2015-09-05 HISTORY — PX: COLONOSCOPY: SHX174

## 2015-09-05 MED ORDER — SODIUM CHLORIDE 0.9 % IV SOLN: 500.0000 mL | INTRAVENOUS | Status: DC

## 2015-09-05 NOTE — Progress Notes (Signed)
Called to room to assist during endoscopic procedure.  Patient ID and intended procedure confirmed with present staff. Received instructions for my participation in the procedure from the performing physician.  

## 2015-09-05 NOTE — Progress Notes (Signed)
Report to PACU, RN, vss, BBS= Clear.  

## 2015-09-05 NOTE — Op Note (Signed)
[facility] Patient Name: Sheri Sullivan Procedure Date: 09/05/2015 8:25 AM MRN: [id number] Endoscopist: Remo Lipps P. Havery Moros , MD Age: 60 Referring MD:  Date of Birth: 1955-07-06 Gender: Female Procedure:                Colonoscopy Indications:              Screening in patient at increased risk: Family                            history of 1st-degree relative with colorectal                            cancer Medicines:                Monitored Anesthesia Care Procedure:                Pre-Anesthesia Assessment:                           - Prior to the procedure, a History and Physical                            was performed, and patient medications and                            allergies were reviewed. The patient's tolerance of                            previous anesthesia was also reviewed. The risks                            and benefits of the procedure and the sedation                            options and risks were discussed with the patient.                            All questions were answered, and informed consent                            was obtained. Prior Anticoagulants: The patient has                            taken aspirin, last dose was 1 day prior to                            procedure. ASA Grade Assessment: II - A patient                            with mild systemic disease. After reviewing the                            risks and benefits, the patient was deemed in  satisfactory condition to undergo the procedure.                           After obtaining informed consent, the colonoscope                            was passed under direct vision. Throughout the                            procedure, the patient's blood pressure, pulse, and                            oxygen saturations were monitored continuously. The                            Model CF-HQ190L [id number]) scope was introduced                             through the anus and advanced to the the cecum,                            identified by appendiceal orifice and ileocecal                            valve. The colonoscopy was performed without                            difficulty. The patient tolerated the procedure                            well. The quality of the bowel preparation was                            adequate. The ileocecal valve, appendiceal orifice,                            and rectum were photographed. Scope In: 8:29:09 AM Scope Out: 8:55:31 AM Scope Withdrawal Time: 0 hours 19 minutes 38 seconds  Total Procedure Duration: 0 hours 26 minutes 22 seconds  Findings:                 The perianal and digital rectal examinations were                            normal.                           Scattered medium-mouthed diverticula were found in                            the entire colon.                           The colon (entire examined portion) was tortuous  making for a prolonged cecal intubation.                           A 3 mm polyp was found in the ileocecal valve. The                            polyp was sessile. The polyp was removed with a                            cold biopsy forceps. Resection and retrieval were                            complete.                           A 3 mm polyp was found in the transverse colon. The                            polyp was sessile. The polyp was removed with a                            cold biopsy forceps. Resection and retrieval were                            complete.                           A 3 mm polyp was found in the rectum. The polyp was                            sessile. The polyp was removed with a cold biopsy                            forceps. Resection and retrieval were complete.                           Non-bleeding internal hemorrhoids were found during                            retroflexion. The hemorrhoids were  moderate.                           The exam was otherwise without abnormality. Complications:            No immediate complications. Estimated blood loss:                            Minimal. Estimated Blood Loss:     Estimated blood loss was minimal. Impression:               - Diverticulosis in the entire examined colon.                           - Tortuous colon.                           -  One 3 mm polyp at the ileocecal valve, removed                            with a cold biopsy forceps. Resected and retrieved.                           - One 3 mm polyp in the transverse colon, removed                            with a cold biopsy forceps. Resected and retrieved.                           - One 3 mm polyp in the rectum, removed with a cold                            biopsy forceps. Resected and retrieved.                           - Non-bleeding internal hemorrhoids.                           - The examination was otherwise normal. Recommendation:           - Patient has a contact number available for                            emergencies. The signs and symptoms of potential                            delayed complications were discussed with the                            patient. Return to normal activities tomorrow.                            Written discharge instructions were provided to the                            patient.                           - Resume previous diet.                           - Continue present medications.                           - Await pathology results.                           - Repeat colonoscopy is recommended for                            surveillance. The colonoscopy date will be  determined after pathology results from today's                            exam become available for review. Remo Lipps P. Armbruster, MD 09/05/2015 9:00:26 AM This report has been signed electronically.

## 2015-09-05 NOTE — Patient Instructions (Signed)
Impression/recommendations:  Polyps (handout given) Diverticulosis (handout given) High Fiber diet (handout given) Hemorrhoids (handout given)  YOU HAD AN ENDOSCOPIC PROCEDURE TODAY AT [facility]:   Refer to the procedure report that was given to you for any specific questions about what was found during the examination.  If the procedure report does not answer your questions, please call your gastroenterologist to clarify.  If you requested that your care partner not be given the details of your procedure findings, then the procedure report has been included in a sealed envelope for you to review at your convenience later.  YOU SHOULD EXPECT: Some feelings of bloating in the abdomen. Passage of more gas than usual.  Walking can help get rid of the air that was put into your GI tract during the procedure and reduce the bloating. If you had a lower endoscopy (such as a colonoscopy or flexible sigmoidoscopy) you may notice spotting of blood in your stool or on the toilet paper. If you underwent a bowel prep for your procedure, you may not have a normal bowel movement for a few days.  Please Note:  You might notice some irritation and congestion in your nose or some drainage.  This is from the oxygen used during your procedure.  There is no need for concern and it should clear up in a day or so.  SYMPTOMS TO REPORT IMMEDIATELY:   Following lower endoscopy (colonoscopy or flexible sigmoidoscopy):  Excessive amounts of blood in the stool  Significant tenderness or worsening of abdominal pains  Swelling of the abdomen that is new, acute  Fever of 100F or higher   For urgent or emergent issues, a gastroenterologist can be reached at any hour by calling [phone].   DIET: Your first meal following the procedure should be a small meal and then it is ok to progress to your normal diet. Heavy or fried foods are harder to digest and may make you feel nauseous or bloated.   Likewise, meals heavy in dairy and vegetables can increase bloating.  Drink plenty of fluids but you should avoid alcoholic beverages for 24 hours.  ACTIVITY:  You should plan to take it easy for the rest of today and you should NOT DRIVE or use heavy machinery until tomorrow (because of the sedation medicines used during the test).    FOLLOW UP: Our staff will call the number listed on your records the next business day following your procedure to check on you and address any questions or concerns that you may have regarding the information given to you following your procedure. If we do not reach you, we will leave a message.  However, if you are feeling well and you are not experiencing any problems, there is no need to return our call.  We will assume that you have returned to your regular daily activities without incident.  If any biopsies were taken you will be contacted by phone or by letter within the next 1-3 weeks.  Please call us at [phone] if you have not heard about the biopsies in 3 weeks.    SIGNATURES/CONFIDENTIALITY: You and/or your care partner have signed paperwork which will be entered into your electronic medical record.  These signatures attest to the fact that that the information above on your After Visit Summary has been reviewed and is understood.  Full responsibility of the confidentiality of this discharge information lies with you and/or your care-partner.

## 2015-09-06 ENCOUNTER — Telehealth: Payer: Self-pay | Admitting: *Deleted

## 2015-09-06 NOTE — Telephone Encounter (Signed)
  Follow up Call-  Call back number 09/05/2015  Post procedure Call Back phone  # [phone]  Permission to leave phone message Yes     Patient questions:  Do you have a fever, pain , or abdominal swelling? No. Pain Score  0 *  Have you tolerated food without any problems? Yes.    Have you been able to return to your normal activities? Yes.    Do you have any questions about your discharge instructions: Diet   No. Medications  No. Follow up visit  No.  Do you have questions or concerns about your Care? No.  Actions: * If pain score is 4 or above: No action needed, pain <4.

## 2015-09-11 ENCOUNTER — Encounter: Payer: Self-pay | Admitting: Gastroenterology

## 2016-08-10 ENCOUNTER — Other Ambulatory Visit: Payer: Self-pay | Admitting: Family Medicine

## 2016-08-10 DIAGNOSIS — R1032 Left lower quadrant pain: Secondary | ICD-10-CM

## 2016-08-12 ENCOUNTER — Ambulatory Visit
Admission: RE | Admit: 2016-08-12 | Discharge: 2016-08-12 | Disposition: A | Discharge status: Home / Self Care | Payer: PRIVATE HEALTH INSURANCE | Source: Ambulatory Visit | Attending: Family Medicine | Admitting: Family Medicine

## 2016-08-12 DIAGNOSIS — R1032 Left lower quadrant pain: Secondary | ICD-10-CM

## 2016-08-12 MED ORDER — IOPAMIDOL (ISOVUE-300) INJECTION 61%
100.0000 mL | Freq: Once | INTRAVENOUS | Status: AC | PRN
  Administered 2016-08-12: 100 mL via INTRAVENOUS

## 2016-08-26 ENCOUNTER — Ambulatory Visit: Payer: PRIVATE HEALTH INSURANCE | Admitting: Physician Assistant

## 2016-09-17 ENCOUNTER — Encounter: Payer: Self-pay | Admitting: Gastroenterology

## 2016-09-17 ENCOUNTER — Ambulatory Visit (INDEPENDENT_AMBULATORY_CARE_PROVIDER_SITE_OTHER): Payer: PRIVATE HEALTH INSURANCE | Admitting: Gastroenterology

## 2016-09-17 ENCOUNTER — Encounter (INDEPENDENT_AMBULATORY_CARE_PROVIDER_SITE_OTHER): Payer: Self-pay

## 2016-09-17 VITALS — BP 100/60 | HR 70 | Ht 68.0 in | Wt 168.8 lb

## 2016-09-17 DIAGNOSIS — A0472 Enterocolitis due to Clostridium difficile, not specified as recurrent: Secondary | ICD-10-CM | POA: Insufficient documentation

## 2016-09-17 NOTE — Progress Notes (Signed)
Agree with assessment and plan as outlined.  

## 2016-09-17 NOTE — Progress Notes (Signed)
09/17/2016 Sheri Sullivan [id number] 04-Jan-1956   HISTORY OF PRESENT ILLNESS:  This is a pleasant 61 year old female who is known to Dr. Havery Moros. She comes to the office today with complaints of generally feeling poorly after recently being treated for C. difficile colitis. The patient tells me that she started with lower abdominal discomfort in late April. She was placed on Cipro and Flagyl empirically for possible diverticulitis. She then started having severe diarrhea. CT scan of the abdomen and pelvis with contrast was performed on May 9 and was negative for diverticulitis. She did complete the course of antibiotics, but continued with diarrhea. On May 22 she had a positive stool study for C. difficile. She was placed on vancomycin for 10 days, which she completed on June 2. She says that her diarrhea has resolved. She is having about 1-2 stools daily that are just loose. She says the she just overall does not feel well some days. She says that some days she feels okay, but days like today she just feels yucky. She did have an episode of C. difficile 5 or 6 years ago at which time she believes she was treated with Flagyl.   Past Medical History:  Diagnosis Date  . Arthritis   . Clostridium difficile infection   . Depression   . Diverticulitis   . Diverticulosis   . Hyperlipidemia   . IBS (irritable bowel syndrome)   . Osteoporosis    osteopenia  . Paroxysmal atrial flutter [facility])    Past Surgical History:  Procedure Laterality Date  . APPENDECTOMY    . BREAST LUMPECTOMY     R breast, benign lump  . COLONOSCOPY    . TUBAL LIGATION      reports that she has quit smoking. Her smoking use included Cigarettes. She quit after 12.00 years of use. She has never used smokeless tobacco. She reports that she drinks about 4.8 oz of alcohol per week . She reports that she does not use drugs. family history includes Breast cancer (age of onset: 57) in her mother; Colon cancer (age of  onset: 5) in her mother; Congestive Heart Failure in her father. Allergies  Allergen Reactions  . Codeine     Makes pt nervous and shaky      Outpatient Encounter Prescriptions as of 09/17/2016  Medication Sig  . aspirin 81 MG tablet Take 81 mg by mouth daily.  . Calcium Carbonate-Vitamin D (CALCIUM + D PO) Take 1 tablet by mouth 2 (two) times daily.    . Ginkgo Biloba 40 MG TABS Take by mouth.  . IBUPROFEN PO Take by mouth as needed (AS NEEDED FOR MILD PAIN OR HEADACHE).   Marland Kitchen lactobacillus acidophilus (BACID) TABS tablet Take 1 tablet by mouth daily.  . multivitamin (THERAGRAN) per tablet Take 1 tablet by mouth daily.    . Omega-3 Fatty Acids (FISH OIL) 1000 MG CAPS Take 1,000 mg by mouth 2 (two) times daily.    . [DISCONTINUED] FIBER PO Take 2 tablets by mouth 2 (two) times daily.   . [DISCONTINUED] metoprolol succinate (TOPROL XL) 25 MG 24 hr tablet Take 1 tablet (25 mg total) by mouth daily. (Patient not taking: Reported on 09/05/2015)  . [DISCONTINUED] Polyethylene Glycol 3350 (MIRALAX PO) Take by mouth daily.    No facility-administered encounter medications on file as of 09/17/2016.      REVIEW OF SYSTEMS  : All other systems reviewed and negative except where noted in the History of Present Illness.  PHYSICAL EXAM: BP 100/60   Pulse 70   Ht 5\' 8"  (1.727 m)   Wt 168 lb 12.8 oz (76.6 kg)   BMI 25.67 kg/m  General: Well developed white female in no acute distress Head: Normocephalic and atraumatic Eyes:  Sclerae anicteric, conjunctiva pink. Ears: Normal auditory acuity Lungs: Clear throughout to auscultation; no increased WOB. Heart: Regular rate and rhythm Abdomen: Soft, non-distended. Normal bowel sounds.  Non-tender. Musculoskeletal: Symmetrical with no gross deformities  Skin: No lesions on visible extremities Extremities: No edema  Neurological: Alert oriented x 4, grossly non-focal Psychological:  Alert and cooperative. Normal mood and affect  ASSESSMENT AND  PLAN: -Recent Cdiff colitis:  Completed vancomycin about 12 days ago.  Diarrhea improved but overall just feels poorly.  Will begin florastor BID.  Bland diet and drink plenty of fluids.  May take a few weeks to bounce back and for stools to go back to complete normal.  If diarrhea returns then will call back.  Also, if no further diarrhea but is still generally feeling poorly in 2 weeks then will call back as well.   CC:  Mayra Neer, MD

## 2016-09-17 NOTE — Patient Instructions (Signed)
Please purchase the following medications over the counter and take as directed:  Florastor twice a day   Call back in 2 weeks if still feeling bad.

## 2016-11-03 ENCOUNTER — Encounter: Payer: Self-pay | Admitting: Gastroenterology

## 2016-11-03 ENCOUNTER — Encounter (INDEPENDENT_AMBULATORY_CARE_PROVIDER_SITE_OTHER): Payer: Self-pay

## 2016-11-03 ENCOUNTER — Ambulatory Visit (INDEPENDENT_AMBULATORY_CARE_PROVIDER_SITE_OTHER): Payer: PRIVATE HEALTH INSURANCE | Admitting: Gastroenterology

## 2016-11-03 VITALS — BP 108/62 | HR 80 | Ht 68.0 in | Wt 170.0 lb

## 2016-11-03 DIAGNOSIS — R195 Other fecal abnormalities: Secondary | ICD-10-CM | POA: Insufficient documentation

## 2016-11-03 DIAGNOSIS — R1084 Generalized abdominal pain: Secondary | ICD-10-CM

## 2016-11-03 NOTE — Progress Notes (Signed)
Agree with assessment and plan as outlined.  

## 2016-11-03 NOTE — Patient Instructions (Signed)
Your physician has requested that you go to the basement for  lab work before leaving today.   

## 2016-11-03 NOTE — Progress Notes (Signed)
11/03/2016 Sheri Sullivan [id number] [birth year]   HISTORY OF PRESENT ILLNESS:  This is a 61 year old female who is known to Dr. Havery Moros. She was last seen here by myself in mid June. She had just completed a course of vancomycin for C. difficile at that time and was having about 2 loose bowel movements daily, but overall still just did not feel well. We planned to continue Florastor probiotic and just give her some time to see if symptoms resolved. She returns today with ongoing complaints of about 2 loose stools per day. She says that her abdomen still just feels very uncomfortable, sometimes like she was punched in the gut. She denies nocturnal bowel movements or blood in her stool. She says that overall she just still feels about the same as she did when she was seen here last time.   Past Medical History:  Diagnosis Date  . Arthritis   . Clostridium difficile infection   . Depression   . Diverticulitis   . Diverticulosis   . Hyperlipidemia   . IBS (irritable bowel syndrome)   . Osteoporosis    osteopenia  . Paroxysmal atrial flutter [facility])    Past Surgical History:  Procedure Laterality Date  . APPENDECTOMY    . BREAST LUMPECTOMY     R breast, benign lump  . COLONOSCOPY    . TUBAL LIGATION      reports that she has quit smoking. Her smoking use included Cigarettes. She quit after 12.00 years of use. She has never used smokeless tobacco. She reports that she drinks about 4.8 oz of alcohol per week . She reports that she does not use drugs. family history includes Breast cancer (age of onset: 55) in her mother; Colon cancer (age of onset: 2) in her mother; Congestive Heart Failure in her father. Allergies  Allergen Reactions  . Codeine     Makes pt nervous and shaky      Outpatient Encounter Prescriptions as of 11/03/2016  Medication Sig  . aspirin 81 MG tablet Take 81 mg by mouth daily.  . Calcium Carbonate-Vitamin D (CALCIUM + D PO) Take 1 tablet by mouth 2  (two) times daily.    . Ginkgo Biloba 40 MG TABS Take by mouth.  . IBUPROFEN PO Take by mouth as needed (AS NEEDED FOR MILD PAIN OR HEADACHE).   Marland Kitchen lactobacillus acidophilus (BACID) TABS tablet Take 1 tablet by mouth daily.  . multivitamin (THERAGRAN) per tablet Take 1 tablet by mouth daily.    . Omega-3 Fatty Acids (FISH OIL) 1000 MG CAPS Take 1,000 mg by mouth 2 (two) times daily.    . Red Yeast Rice 600 MG CAPS Take 1 capsule by mouth daily.   No facility-administered encounter medications on file as of 11/03/2016.      REVIEW OF SYSTEMS  : All other systems reviewed and negative except where noted in the History of Present Illness.   PHYSICAL EXAM: BP 108/62 (BP Location: Right Arm, Patient Position: Sitting, Cuff Size: Normal)   Pulse 80   Ht 5\' 8"  (1.727 m)   Wt 170 lb (77.1 kg)   BMI 25.85 kg/m  General: Well developed white female in no acute distress Head: Normocephalic and atraumatic Eyes:  Sclerae anicteric, conjunctiva pink. Ears: Normal auditory acuity Lungs: Clear throughout to auscultation; no increased WOB. Heart: Regular rate and rhythm; no M/R/G. Abdomen: Soft, non-distended.  BS present.  Minimal diffuse TTP. Musculoskeletal: Symmetrical with no gross deformities  Skin: No lesions  on visible extremities Extremities: No edema  Neurological: Alert oriented x 4, grossly non-focal Psychological:  Alert and cooperative. Normal mood and affect  ASSESSMENT AND PLAN: -Loose stools and abdominal pain:  Finished treatment for Cdiff at the beginning of June and has not really improved any further after that point.  Will start by repeating stool study for Cdiff.  She has antispasmodic at home and can try that prn for now.  CC:  Mayra Neer, MD

## 2016-11-04 ENCOUNTER — Other Ambulatory Visit: Payer: PRIVATE HEALTH INSURANCE

## 2016-11-04 DIAGNOSIS — R1084 Generalized abdominal pain: Secondary | ICD-10-CM

## 2016-11-04 DIAGNOSIS — R195 Other fecal abnormalities: Secondary | ICD-10-CM

## 2016-11-05 LAB — CLOSTRIDIUM DIFFICILE BY PCR: CDIFFPCR: NOT DETECTED

## 2017-11-24 ENCOUNTER — Other Ambulatory Visit: Payer: Self-pay | Admitting: Obstetrics and Gynecology

## 2017-11-24 DIAGNOSIS — E2839 Other primary ovarian failure: Secondary | ICD-10-CM

## 2018-05-16 ENCOUNTER — Other Ambulatory Visit: Payer: Self-pay | Admitting: Family Medicine

## 2018-05-16 ENCOUNTER — Ambulatory Visit
Admission: RE | Admit: 2018-05-16 | Discharge: 2018-05-16 | Disposition: A | Discharge status: Home / Self Care | Payer: PRIVATE HEALTH INSURANCE | Source: Ambulatory Visit | Attending: Family Medicine | Admitting: Family Medicine

## 2018-05-16 DIAGNOSIS — M79671 Pain in right foot: Secondary | ICD-10-CM

## 2019-05-10 ENCOUNTER — Ambulatory Visit (INDEPENDENT_AMBULATORY_CARE_PROVIDER_SITE_OTHER): Payer: BC Managed Care – PPO | Admitting: Gastroenterology

## 2019-05-10 ENCOUNTER — Encounter: Payer: Self-pay | Admitting: Gastroenterology

## 2019-05-10 ENCOUNTER — Other Ambulatory Visit: Payer: Self-pay

## 2019-05-10 ENCOUNTER — Encounter (INDEPENDENT_AMBULATORY_CARE_PROVIDER_SITE_OTHER): Payer: Self-pay

## 2019-05-10 VITALS — BP 110/70 | HR 60 | Temp 98.3°F | Ht 68.0 in | Wt 170.0 lb

## 2019-05-10 DIAGNOSIS — K5900 Constipation, unspecified: Secondary | ICD-10-CM

## 2019-05-10 DIAGNOSIS — K642 Third degree hemorrhoids: Secondary | ICD-10-CM

## 2019-05-10 DIAGNOSIS — K602 Anal fissure, unspecified: Secondary | ICD-10-CM | POA: Diagnosis not present

## 2019-05-10 DIAGNOSIS — R198 Other specified symptoms and signs involving the digestive system and abdomen: Secondary | ICD-10-CM | POA: Diagnosis not present

## 2019-05-10 MED ORDER — AMBULATORY NON FORMULARY MEDICATION: 1 refills | Status: AC

## 2019-05-10 NOTE — Progress Notes (Signed)
HPI :  64 year old female here for a follow-up visit for rectal bleeding and rectal pain.  She has a history of C. difficile, history of mother who had colon cancer around age 48, history of colon polyps, history of internal hemorrhoids.  Her last colonoscopy was in June 2017, at which time she had 2 small adenomas and moderate-sized internal hemorrhoids.  She states she has been dealing with hemorrhoids for a long time.  Over the past year her symptoms have gotten much worse.  She has intermittent bleeding that comes and goes.  She will have bleeding for a few days in a row, then she will notice it for a while.  She sees bright red blood in the stool at times with this.  She has about 2 bowel movements per day.  Stools are soft, she takes MiraLAX and fiber every day to keep stool soft, however complain she has a sense of straining and incomplete evacuation.  She does spend time in the toilet every day in light of the straining.  In addition to this she has had some significant rectal pain that occurs with her bowel movements.  Usually this will occur with a bowel movement and last for a little while afterwards and then goes away.  If she coughs very hard she will also feel it.  She denies any other pains in her abdomen.  She is not on any anticoagulants.  Otherwise feels well without any complaints.  Her hemorrhoids prolapse and she has to use manual maneuvers to reduce them, grade 3.  She is never had any prior hemorrhoid therapy.  She uses Preparation H as needed and sits baths routinely to help reduce symptoms but having a hard time recently with this despite these measures.  Colonoscopy 09/05/2015 - Diverticulosis in the entire examined colon. - Tortuous colon. - One 3 mm polyp at the ileocecal valve, removed with a cold biopsy forceps. Resected and retrieved. - One 3 mm polyp in the transverse colon, removed with a cold biopsy forceps. Resected and retrieved. - One 3 mm polyp in the rectum,  removed with a cold biopsy forceps. Resected and retrieved. - Non-bleeding internal hemorrhoids. - The examination was otherwise normal.  2 polyps adenomatous   Past Medical History:  Diagnosis Date  . Anxiety   . Arthritis   . Clostridium difficile infection   . Depression   . Diastolic dysfunction   . Diverticulitis   . Diverticulosis   . GERD (gastroesophageal reflux disease)   . History of colon polyps 2017  . Hypercholesterolemia   . Hyperlipidemia   . IBS (irritable bowel syndrome)   . Osteoporosis    osteopenia  . Paroxysmal atrial flutter [facility]      Past Surgical History:  Procedure Laterality Date  . APPENDECTOMY    . BREAST LUMPECTOMY     R breast, benign lump  . COLONOSCOPY    . TUBAL LIGATION     Family History  Problem Relation Age of Onset  . Colon cancer Mother 79       smoker  . Breast cancer Mother 29  . Congestive Heart Failure Father        smoker   Social History   Tobacco Use  . Smoking status: Former Smoker    Years: 12.00    Types: Cigarettes  . Smokeless tobacco: Never Used  Substance Use Topics  . Alcohol use: Yes    Alcohol/week: 8.0 standard drinks    Types: 8 Glasses of  wine per week  . Drug use: No   Current Outpatient Medications  Medication Sig Dispense Refill  . Calcium Carbonate-Vitamin D (CALCIUM + D PO) Take 1 tablet by mouth 2 (two) times daily.      . IBUPROFEN PO Take by mouth as needed (AS NEEDED FOR MILD PAIN OR HEADACHE).     Marland Kitchen lactobacillus acidophilus (BACID) TABS tablet Take 1 tablet by mouth daily.    . multivitamin (THERAGRAN) per tablet Take 1 tablet by mouth daily.      . Omega-3 Fatty Acids (FISH OIL) 1000 MG CAPS Take 1,000 mg by mouth 2 (two) times daily.      . polyethylene glycol (MIRALAX / GLYCOLAX) 17 g packet Take 17 g by mouth daily.    . Red Yeast Rice 600 MG CAPS Take 1 capsule by mouth daily.     No current facility-administered medications for this visit.   Allergies  Allergen Reactions   . Codeine     Makes pt nervous and shaky  . Septra [Sulfamethoxazole-Trimethoprim] Nausea Only     Review of Systems: All systems reviewed and negative except where noted in HPI.   No results found for: WBC, HGB, HCT, MCV, PLT  Lab Results  Component Value Date   CREATININE 0.83 06/06/2014   BUN 15 06/06/2014   NA 140 06/06/2014   K 4.1 06/06/2014   CL 105 06/06/2014   CO2 28 06/06/2014    Lab Results  Component Value Date   ALT 16 06/06/2014   AST 20 06/06/2014   ALKPHOS 51 06/06/2014   BILITOT 0.6 06/06/2014     Physical Exam: BP 110/70   Pulse 60   Temp 98.3 F (36.8 C)   Ht 5\' 8"  (1.727 m)   Wt 170 lb (77.1 kg)   BMI 25.85 kg/m  Constitutional: Pleasant,well-developed, female in no acute distress. HEENT: Normocephalic and atraumatic. Conjunctivae are normal. No scleral icterus. Neck supple.  Cardiovascular: Normal rate, regular rhythm.  Pulmonary/chest: Effort normal and breath sounds normal. No wheezing, rales or rhonchi. Abdominal: Soft, nondistended, nontender.  There are no masses palpable.  DRE - [place] standby - external hemorrhoids, anal fissure located in anterior midline anal canal, normal resting tone, normal squeeze, failure of decent - dyssynergia noted Extremities: no edema Lymphadenopathy: No cervical adenopathy noted. Neurological: Alert and oriented to person place and time. Skin: Skin is warm and dry. No rashes noted. Psychiatric: Normal mood and affect. Behavior is normal.   ASSESSMENT AND PLAN: 64 year old female here for reassessment of the following issues:  Anal fissure / Grade III hemorrhoids / Constipation / Pelvic floor dyssynergia - history as above.  Physical exam confirms an anal fissure in the anterior midline anal canal which is likely the cause of her pain.  She also has grade 3 hemorrhoids which appear inflamed in the setting of straining/constipation, combination of these is probably causing her bleeding.  Her  colonoscopy is otherwise up-to-date.  I discussed options with her.  She continues to have straining despite having looser or soft stools.  On DRE she does have evidence of pelvic floor dyssynergia and I think she would benefit from pelvic floor PT to treat this.  I will place the referral and she was agreeable to this.  Otherwise while she may warrant hemorrhoid banding at some point time to treat her hemorrhoids, she warrants management of the anal fissure first and to have that healed initially.  Recommending topical 0.125% nitroglycerin ointment, pea-sized amount applied  PR 3 times a day for the next 6 weeks or so until healed.  I will see her back in 2 months or so for reassessment.  If the fissure has healed the hemorrhoids still causing symptoms following this course will then proceed with banding.  Again, holding off on banding right now given the anal fissure and would like her dyssynergia addressed first as well.  She agreed with the plan, all questions answered.  I spent 35 minutes of time, including in depth chart review, independent review of results as outlined above, communicating results with the patient directly, face-to-face time with the patient, coordinating care, and ordering studies and medications as appropriate, and documenting this encounter.  [place] Cellar, MD [facility]  CC: Mayra Neer, MD

## 2019-05-10 NOTE — Patient Instructions (Addendum)
Normal BMI (Body Mass Index- based on height and weight) is between 19 and 25. Your BMI today is Body mass index is 25.85 kg/m. Marland Kitchen Please consider follow up  regarding your BMI with your Primary Care Provider.  We have sent a prescription for nitroglycerin 0.125% gel to [facility]. You should apply a pea size amount to your rectum three times daily x 6-8 weeks.  [facility]'s information is below: Address: [address] [place], [place] [address]  [phone]  *Please DO NOT go directly from our office to pick up this medication! Give the pharmacy 1 day to process the prescription as this is compounded and takes time to make.   We will refer you for Pelvic Floor Physical Therapy. They will contact you to schedule an appointment.  Continue fiber and Miralax.   You will be due for an office visit at the beginning of April. We will send you a reminder in the mail when it gets closer to that time.  Thank you for entrusting me with your care and for choosing [facility], Dr. [place] Cellar

## 2019-05-12 DIAGNOSIS — Z0184 Encounter for antibody response examination: Secondary | ICD-10-CM | POA: Diagnosis not present

## 2019-05-12 DIAGNOSIS — Z23 Encounter for immunization: Secondary | ICD-10-CM | POA: Diagnosis not present

## 2019-05-22 ENCOUNTER — Ambulatory Visit: Payer: BC Managed Care – PPO | Attending: Gastroenterology | Admitting: Physical Therapy

## 2019-06-07 ENCOUNTER — Other Ambulatory Visit: Payer: Self-pay

## 2019-06-07 ENCOUNTER — Encounter: Payer: Self-pay | Admitting: Physical Therapy

## 2019-06-07 ENCOUNTER — Ambulatory Visit: Payer: BC Managed Care – PPO | Attending: Gastroenterology | Admitting: Physical Therapy

## 2019-06-07 DIAGNOSIS — M6281 Muscle weakness (generalized): Secondary | ICD-10-CM | POA: Insufficient documentation

## 2019-06-07 DIAGNOSIS — R278 Other lack of coordination: Secondary | ICD-10-CM | POA: Diagnosis not present

## 2019-06-07 DIAGNOSIS — K5900 Constipation, unspecified: Secondary | ICD-10-CM | POA: Diagnosis not present

## 2019-06-07 NOTE — Therapy (Signed)
[facility] [address] [place], [place], [address] Phone: [phone]   Fax:  [phone]  Physical Therapy Evaluation  Patient Details  Name: Sheri Sullivan MRN: [id number] Date of Birth: [birth year] Referring Provider (PT): Dr. [place] Cellar   Encounter Date: 06/07/2019  PT End of Session - 06/07/19 1127    Visit Number  1    Date for PT Re-Evaluation  08/30/19    Authorization Type  BCBS    Authorization - Visit Number  1    Authorization - Number of Visits  90    PT Start Time  0930    PT Stop Time  1010    PT Time Calculation (min)  40 min    Activity Tolerance  Patient tolerated treatment well;No increased pain    Behavior During Therapy  WFL for tasks assessed/performed       Past Medical History:  Diagnosis Date  . Anxiety   . Arthritis   . Clostridium difficile infection   . Depression   . Diastolic dysfunction   . Diverticulitis   . Diverticulosis   . GERD (gastroesophageal reflux disease)   . History of colon polyps 2017  . Hypercholesterolemia   . Hyperlipidemia   . IBS (irritable bowel syndrome)   . Osteoporosis    osteopenia  . Paroxysmal atrial flutter [facility])     Past Surgical History:  Procedure Laterality Date  . APPENDECTOMY    . BREAST LUMPECTOMY     R breast, benign lump  . COLONOSCOPY    . TUBAL LIGATION      There were no vitals filed for this visit.   Subjective Assessment - 06/07/19 1020    Subjective  Patient reports her hemmorroids have been an issue and has an anal fissure. MD feels her pelvic floor muscles are dysfunctional. I am using the nitroglycerine for the fissures. Patient has to strain to have a bowel movement. No urinary leakage.    Patient Stated Goals  improve bowel movements, improve anal fissure and hemorroids    Currently in Pain?  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 [place] Cellar    Prior Therapy  none      Precautions   Precautions  Other (comment)    Precaution Comments  osteoporosis      Restrictions   Weight Bearing Restrictions  No      Balance Screen   Has the patient fallen in the past 6 months  Yes    How many times?  2   something happened with shoe, falling up stairs   Has the patient had a decrease in activity level because of a fear of falling?   No   fell due to being in a hurry   Is the patient reluctant to leave their home because of a fear of falling?   No   fall not due to balance     [place] residence      Prior Function   Level of [place]  Retired    Leisure  you tube exercise program with cardio, yoga and pilates      Cognition   Overall Cognitive Status  Within Functional Limits for tasks  assessed      Observation/Other Assessments   Focus on Therapeutic Outcomes (FOTO)   PFIQ-7 14%; CRAIQ-7 43      Posture/Postural Control   Posture/Postural Control  No significant limitations      ROM / Strength   AROM / PROM / Strength  AROM;PROM;Strength      AROM   Overall AROM Comments  lumbar ROM is full      Strength   Overall Strength Comments  bil. hips 5/5      Palpation   SI assessment   ASIS are equal    Palpation comment  pain in left upper quadrant                Objective measurements completed on examination: See above findings.    Pelvic Floor Special Questions - 06/07/19 0001    Prior Pregnancies  Yes    Number of Pregnancies  2    Number of Vaginal Deliveries  2    Any difficulty with labor and deliveries  No    Urinary Leakage  No    Fecal incontinence  No    Falling out feeling (prolapse)  Yes    Activities that cause feeling of prolapse  when tired, after exercising hard, standing for 5 hours     Skin Integrity  Intact   vaignal is dry   Pelvic Floor Internal Exam  Patient confirms identification and approves PT to assess pelvic floor and treatment    Exam Type  Rectal    Palpation  tenderness located on the left side of levator ani and obturator internist externally; while index finger in the anal canal felt fascial releases    Strength  fair squeeze, definite lift       [facility] Adult PT Treatment/Exercise - 06/07/19 0001      Self-Care   Self-Care  Other Self-Care Comments    Other Self-Care Comments   vaginal health using cocnut oil for moisture      Therapeutic Activites    Therapeutic Activities  Other Therapeutic Activities    Other Therapeutic Activities  instruction on correct toileting technique to relax the pelvic floor and correct breathing             PT Education - 06/07/19 1051    Education Details  toileting; vaginal moisturizer    Person(s) Educated  Patient    Methods  Explanation;Demonstration;Handout;Verbal cues    Comprehension  Verbalized understanding       PT Short Term Goals - 06/07/19 1113      PT SHORT TERM GOAL #1   Title  independent with initial HEP    Time  4    Period  Weeks    Status  New    [place] Date  07/05/19      PT SHORT TERM GOAL #2   Title  understand correct toileting technique to reduce strain on the pelvic floor    Time  4    Period  Weeks    Status  New    Target Date  07/05/19      PT SHORT TERM GOAL #3   Title  understand correct abdominal massage to assist in peristalic motion of the intestines and improve bowel movements    Time  12    Period  Weeks    Status  New    Target Date  08/30/19        PT Long Term Goals - 06/07/19 1114  PT LONG TERM GOAL #1   Title  independent with advanced HEP    Time  12    Period  Weeks    Status  New    Target Date  08/30/19      PT LONG TERM GOAL #2   Title  strain to have a bowel movement with >/= 75% less straining due to ablility to relax the pelvic  floor    Time  12    Period  Weeks    Status  New    Target Date  08/30/19      PT LONG TERM GOAL #3   Title  understand vaginal health to improve moisture and around the anal canal    Time  12    Period  Weeks    Status  New    Target Date  08/30/19      PT LONG TERM GOAL #4   Title  understand how to massage around the anus prior to and after a bowel movement to reduce hemorroids and fissures    Time  12    Period  Weeks    Status  New    Target Date  08/30/19      PT LONG TERM GOAL #5   Title  PFIQ-7 score </= 5% due to improvement of constipation    Time  12    Period  Weeks    Status  New    Target Date  08/30/19             Plan - 06/07/19 1059    Clinical Impression Statement  Patient is a 63 year old female with constipation, hemorroids, and anal fissure. Patient will strain to have a bowel movement. Patient reports a falling out feeling vaginally when standing for long hours or after exercise. Patient has vaginal dryness. Anal pelvic floor strength is 3/5 holding for 5 seconds. Patient will tighten when bearing down to push the therapist finger out. Palpable tenderness externally on the left levator ani and obturator internist. Fascial releases felt while therapist index finger was placed in the anal canal. Patient has tightness in the diaphgram. Patient will benefit from skilled therapy to improve coordination of the pelvic floor muscles to reduce straining to cause the hemorroids and fissure.    Personal Factors and Comorbidities  Comorbidity 2    Comorbidities  IBS; osteoporosis; hemorroids, anal fissure    Examination-Activity Limitations  Toileting    Stability/Clinical Decision Making  Evolving/Moderate complexity    Clinical Decision Making  Low    Rehab Potential  Excellent    PT Frequency  1x / week    PT Duration  12 weeks    PT Treatment/Interventions  Biofeedback;Cryotherapy;Electrical Stimulation;Moist Heat;Neuromuscular re-education;Therapeutic  exercise;Therapeutic activities;Patient/family education;Manual techniques;Dry needling    PT Next Visit Plan  check on vaginal dryness, go over toileting, abdominal massage, diaphragmatic breathing with squat on ball    Consulted and Agree with Plan of Care  Patient       Patient will benefit from skilled therapeutic intervention in order to improve the following deficits and impairments:  Increased fascial restricitons, Decreased endurance, Increased muscle spasms, Pain, Decreased strength  Visit Diagnosis: Muscle weakness (generalized) - Plan: PT plan of care cert/re-cert  Other lack of coordination - Plan: PT plan of care cert/re-cert  Constipation, unspecified constipation type - Plan: PT plan of care cert/re-cert     Problem List Patient Active Problem List   Diagnosis Date Noted  . Loose stools  11/03/2016  . Generalized abdominal pain 11/03/2016  . Enteritis due to Clostridium difficile 09/17/2016  . SVT (supraventricular tachycardia) ([place]) 01/09/2014  . FH: colon cancer 08/18/2010  . Benign neoplasm of colon 08/18/2010  . Diverticula of colon 08/18/2010  . Diverticulitis of colon 08/18/2010  . Hemorrhoids, internal 08/18/2010    Earlie Counts, PT 06/07/19 11:33 AM   [facility] [address] [place], [place], [address] Phone: [phone]   Fax:  [phone]  Name: LURDES STULTZ MRN: [id number] Date of Birth: 1955/08/10

## 2019-06-07 NOTE — Patient Instructions (Signed)
Moisturizers . They are used in the vagina to hydrate the mucous membrane that make up the vaginal canal. . Designed to keep a more normal acid balance (ph) . Once placed in the vagina, it will last between two to three days.  . Use 2-3 times per week at bedtime  . Ingredients to avoid is glycerin and fragrance, can increase chance of infection . Should not be used just before sex due to causing irritation . Most are gels administered either in a tampon-shaped applicator or as a vaginal suppository. They are non-hormonal.   Types of Moisturizers  . Vitamin E vaginal suppositories- Whole foods, Amazon . Moist Again . Coconut oil- can break down condoms . Julva- (Do no use if on Tamoxifen) amazon . Yes moisturizer- amazon . NeuEve Silk , NeuEve Silver for menopausal or over 65 (if have severe vaginal atrophy or cancer treatments use NeuEve Silk for  1 month than move to [employer] Pepsi)- [employer], MapleFlower.dk . Olive and Bee intimate cream- www.oliveandbee.com.au . Mae vaginal [place] . Aloe .    Creams to use externally on the Vulva area  Albertson's (good for for cancer patients that had radiation to the area)- [place] or [employer].FlyingBasics.com.br  V-magic cream - amazon  Julva-amazon  Vital "V [place] Yam salve ( help moisturize and help with thinning vulvar area, does have [place] by Irwin Brakeman labial moisturizer (Amazon,   Coconut or olive oil  Aloe  Toileting Techniques for Bowel Movements (Defecation) Using your belly (abdomen) and pelvic floor muscles to have a bowel movement is usually instinctive.  Sometimes people can have problems with these muscles and have to relearn proper defecation (emptying) techniques.  If you have weakness in your muscles, organs that are falling out, decreased sensation in your pelvis, or ignore your urge to go, you may find yourself straining to have a  bowel movement.  You are straining if you are: . holding your breath or taking in a huge gulp of air and holding it  . keeping your lips and jaw tensed and closed tightly . turning red in the face because of excessive pushing or forcing . developing or worsening your  hemorrhoids . getting faint while pushing . not emptying completely and have to defecate many times a day  If you are straining, you are actually making it harder for yourself to have a bowel movement.  Many people find they are pulling up with the pelvic floor muscles and closing off instead of opening the anus. Due to lack pelvic floor relaxation and coordination the abdominal muscles, one has to work harder to push the feces out.  Many people have never been taught how to defecate efficiently and effectively.  Notice what happens to your body when you are having a bowel movement.  While you are sitting on the toilet pay attention to the following areas: . Jaw and mouth position . Angle of your hips   . Whether your feet touch the ground or not . Arm placement  . Spine position . Waist . Belly tension . Anus (opening of the anal canal)  An Evacuation/Defecation Plan   Here are the 4 basic points:  1. Lean forward enough for your elbows to rest on your knees 2. Support your feet on the floor or use a low stool if your feet don't touch the floor  3. Push out your belly as if you  have swallowed a beach ball--you should feel a widening of your waist 4. Open and relax your pelvic floor muscles, rather than tightening around the anus       The following conditions my require modifications to your toileting posture:  . If you have had surgery in the past that limits your back, hip, pelvic, knee or ankle flexibility . Constipation   Your healthcare practitioner may make the following additional suggestions and adjustments:  1) Sit on the toilet  a) Make sure your feet are supported. b) Notice your hip angle and spine  position--most people find it effective to lean forward or raise their knees, which can help the muscles around the anus to relax  c) When you lean forward, place your forearms on your thighs for support  2) Relax suggestions a) Breath deeply in through your nose and out slowly through your mouth as if you are smelling the flowers and blowing out the candles. b) To become aware of how to relax your muscles, contracting and releasing muscles can be helpful.  Pull your pelvic floor muscles in tightly by using the image of holding back gas, or closing around the anus (visualize making a circle smaller) and lifting the anus up and in.  Then release the muscles and your anus should drop down and feel open. Repeat 5 times ending with the feeling of relaxation. c) Keep your pelvic floor muscles relaxed; let your belly bulge out. d) The digestive tract starts at the mouth and ends at the anal opening, so be sure to relax both ends of the tube.  Place your tongue on the roof of your mouth with your teeth separated.  This helps relax your mouth and will help to relax the anus at the same time.  3) Empty (defecation) a) Keep your pelvic floor and sphincter relaxed, then bulge your anal muscles.  Make the anal opening wide.  b) Stick your belly out as if you have swallowed a beach ball. c) Make your belly wall hard using your belly muscles while continuing to breathe. Doing this makes it easier to open your anus. d) Breath out and give a grunt (or try using other sounds such as ahhhh, shhhhh, ohhhh or grrrrrrr).  4) Finish a) As you finish your bowel movement, pull the pelvic floor muscles up and in.  This will leave your anus in the proper place rather than remaining pushed out and down. If you leave your anus pushed out and down, it will start to feel as though that is normal and give you incorrect signals about needing to have a bowel movement.    Things to avoid in the vaginal area . Do not use things  to irritate the vulvar area . No lotions just specialized creams for the vulva area- Neogyn, V-magic, No soaps; can use Aveeno or Calendula cleanser if needed. Must be gentle . No deodorants . No douches . Good to sleep without underwear to let the vaginal area to air out . No scrubbing: spread the lips to let warm water rinse over labias and pat dry  [facility] [address], [place], [place] [address] Phone # [phone] Fax [phone]

## 2019-06-16 DIAGNOSIS — Z20822 Contact with and (suspected) exposure to covid-19: Secondary | ICD-10-CM | POA: Diagnosis not present

## 2019-06-27 ENCOUNTER — Telehealth: Payer: Self-pay | Admitting: *Deleted

## 2019-06-27 NOTE — Telephone Encounter (Signed)
Pt calling regarding questionnaire prior to vaccine. Answered questions to pts satisfaction. Advised to CB if any additional questions arise.

## 2019-07-01 ENCOUNTER — Ambulatory Visit: Payer: BC Managed Care – PPO | Attending: Internal Medicine

## 2019-07-01 DIAGNOSIS — Z23 Encounter for immunization: Secondary | ICD-10-CM

## 2019-07-01 NOTE — Progress Notes (Signed)
   Covid-19 Vaccination Clinic  Name:  Sheri Sullivan    MRN: [id number] DOB: 1955-08-31  07/01/2019  [name] was observed post Covid-19 immunization for 15 minutes without incident. She was provided with Vaccine Information Sheet and instruction to access the V-Safe system.   [name] was instructed to call 911 with any severe reactions post vaccine: Marland Kitchen Difficulty breathing  . Swelling of face and throat  . A fast heartbeat  . A bad rash all over body  . Dizziness and weakness   Immunizations Administered    Name Date Dose VIS Date Route   Pfizer COVID-19 Vaccine 07/01/2019 10:07 AM 0.3 mL 03/17/2019 Intramuscular   Manufacturer: [place]   Lot: [id number]   [place]: [id number]

## 2019-07-03 ENCOUNTER — Other Ambulatory Visit: Payer: Self-pay

## 2019-07-03 ENCOUNTER — Ambulatory Visit: Payer: BC Managed Care – PPO | Admitting: Physical Therapy

## 2019-07-03 ENCOUNTER — Encounter: Payer: Self-pay | Admitting: Physical Therapy

## 2019-07-03 DIAGNOSIS — M6281 Muscle weakness (generalized): Secondary | ICD-10-CM | POA: Diagnosis not present

## 2019-07-03 DIAGNOSIS — K5900 Constipation, unspecified: Secondary | ICD-10-CM | POA: Diagnosis not present

## 2019-07-03 DIAGNOSIS — R278 Other lack of coordination: Secondary | ICD-10-CM | POA: Diagnosis not present

## 2019-07-03 NOTE — Patient Instructions (Addendum)
Toileting Techniques for Bowel Movements    An Evacuation/Defecation Plan   Here are the 4 basic points:  1. Lean forward enough for your elbows to rest on your knees 2. Support your feet on the floor or use a low stool if your feet don't touch the floor  3. Push out your belly as if you have swallowed a beach ball--you should feel a widening of your waist. "Belly Big, Belly Hard" 4. Open and relax your pelvic floor muscles, rather than tightening around the anus  While you are sitting on the toilet pay attention to the following areas: . Jaw and mouth position- relaxed not clenched . Angle of your hips - leaning slightly forward . Whether your feet touch the ground or not - should be flat and supported . Arm placement - rest against your thighs . Spine position - flat back . Waist . Breathing - exhale as you push (like blowing up a balloon or try using other sounds such as ahhhh, shhhhh, ohhhh or grrrrrrr) . Belly - hard and tight as you push . Anus (opening of the anal canal) - relaxed and open as you push . Anus - Tighten and lift pulling the muscle back in after you are done or if taking a break  If you are not successful after 10-15 minutes, try again later.  Avoid negative self-talk about your toileting experience.   Read this for more details and ask your PT if you need suggestions for adjustments or limitations:  1) Sitting on the toilet  a) Make sure your feet are supported - flat on the floor or step stool b) Many people find it effective to lean forward or raise their knees.  Propping your feet on a step stool (squatty potty is a brand name) can help the muscles around the anus to relax  c) When you lean forward, place your forearms on your thighs for support  2) Relaxing a) Breathe deeply and slowly in through your nose and out through your mouth. b) To become aware of how to relax your muscles, contracting and releasing muscles can be helpful.  Pull your pelvic floor  muscles in tightly by using the image of holding back gas, or closing around the anus (visualize making a circle smaller) and lifting the anus up and in.  Then release the muscles and your anus should drop down and feel open. Repeat 5 times ending with the feeling of relaxation. c) Keep your pelvic floor muscles relaxed; let your belly bulge out. d) The digestive tract starts at the mouth and ends at the anal opening, so be sure to relax both ends of the tube.  Place your tongue on the roof of your mouth with your teeth separated.  This helps relax your mouth and will help to relax the anus at the same time.  3) Emptying (defecation) a) Keep your pelvic floor and sphincter relaxed, then bulge your anal muscles.  Make the anal opening wide.  b) Stick your belly out as if you have swallowed a beach ball. c) Make your belly wall hard using your belly muscles while continuing to breathe. Doing this makes it easier to open your anus. d) Breath out and give and give a deep breath out e)  Can also try to act as if you are blowing up a balloon as you push  4) Finishing a) As you finish your bowel movement, pull the pelvic floor muscles up and in.  This will leave your  anus in the proper place rather than remaining pushed out and down. If you leave your anus pushed out and down, it will start to feel as though that is normal and give you incorrect signals about needing to have a bowel movement.  About Abdominal Massage  Abdominal massage, also called external colon massage, is a self-treatment circular massage technique that can reduce and eliminate gas and ease constipation. The colon naturally contracts in waves in a clockwise direction starting from inside the right hip, moving up toward the ribs, across the belly, and down inside the left hip.  When you perform circular abdominal massage, you help stimulate your colon's normal wave pattern of movement called peristalsis.  It is most beneficial when done  after eating.  Positioning You can practice abdominal massage with oil while lying down, or in the shower with soap.  Some people find that it is just as effective to do the massage through clothing while sitting or standing.  How to Massage Start by placing your finger tips or knuckles on your right side, just inside your hip bone.  . Make small circular movements while you move upward toward your rib cage.   . Once you reach the bottom right side of your rib cage, take your circular movements across to the left side of the bottom of your rib cage.  . Next, move downward until you reach the inside of your left hip bone.  This is the path your feces travel in your colon. . Continue to perform your abdominal massage in this pattern for 10 minutes each day.     You can apply as much pressure as is comfortable in your massage.  Start gently and build pressure as you continue to practice.  Notice any areas of pain as you massage; areas of slight pain may be relieved as you massage, but if you have areas of significant or intense pain, consult with your healthcare provider.  Other Considerations . General physical activity including bending and stretching can have a beneficial massage-like effect on the colon.  Deep breathing can also stimulate the colon because breathing deeply activates the same nervous system that supplies the colon.   . Abdominal massage should always be used in combination with a bowel-conscious diet that is high in the proper type of fiber for you, fluids (primarily water), and a regular exercise program.   Laying on your back filling air into upper chest, then lower rib cage then abdomen,  As you breath out deflate the abdomen, lower rib cage then upper chest  5x 1-2x per day  Do 5 times prior to bowel movement Access Code: [email or web address] URL: [email or web address] Date: 07/03/2019 Prepared by: Earlie Counts  Exercises Seated Hamstring Stretch - 1 x daily - 7 x  weekly - 1 sets - 2 reps - 30 sec hold Seated Piriformis Stretch with Trunk Bend - 1 x daily - 7 x weekly - 1 sets - 2 reps - 30 sec hold Supine Butterfly Groin Stretch - 1 x daily - 7 x weekly - 1 sets - 1 reps - 1 minute hold [facility] [address] [place], [place] [address] Phone # [phone] Fax [phone]

## 2019-07-03 NOTE — Therapy (Signed)
[facility] [address] [place], [place], [address] Phone: [phone]   Fax:  [phone]  Physical Therapy Treatment  Patient Details  Name: Sheri Sullivan MRN: [id number] Date of Birth: [birth year] Referring Provider (PT): Dr. [place] Cellar   Encounter Date: 07/03/2019  PT End of Session - 07/03/19 1401    Visit Number  2    Date for PT Re-Evaluation  08/30/19    Authorization Type  BCBS    Authorization - Visit Number  2    Authorization - Number of Visits  90    PT Start Time  1400    PT Stop Time  1440    PT Time Calculation (min)  40 min    Activity Tolerance  Patient tolerated treatment well;No increased pain    Behavior During Therapy  WFL for tasks assessed/performed       Past Medical History:  Diagnosis Date  . Anxiety   . Arthritis   . Clostridium difficile infection   . Depression   . Diastolic dysfunction   . Diverticulitis   . Diverticulosis   . GERD (gastroesophageal reflux disease)   . History of colon polyps 2017  . Hypercholesterolemia   . Hyperlipidemia   . IBS (irritable bowel syndrome)   . Osteoporosis    osteopenia  . Paroxysmal atrial flutter [facility])     Past Surgical History:  Procedure Laterality Date  . APPENDECTOMY    . BREAST LUMPECTOMY     R breast, benign lump  . COLONOSCOPY    . TUBAL LIGATION      There were no vitals filed for this visit.  Subjective Assessment - 07/03/19 1402    Subjective  I felt the breathing exercise helps. I have trouble with pushing out.    Patient Stated Goals  improve bowel movements, improve anal fissure and hemorroids    Currently in Pain?  Yes    Pain Score  4     Pain Location  Rectum    Pain Orientation  Mid    Pain Descriptors / Indicators  --   stinging   Pain Type  Acute pain    Pain Onset  More than a month ago    Pain Frequency  Intermittent    Aggravating Factors   contract the anus    Pain Relieving Factors   relaxing the sphincter muscle    Multiple Pain Sites  No                       [facility] Adult PT Treatment/Exercise - 07/03/19 0001      Self-Care   Self-Care  Other Self-Care Comments    Other Self-Care Comments   anal care using moisturizers to protect the skin      Therapeutic Activites    Therapeutic Activities  Other Therapeutic Activities    Other Therapeutic Activities  reviewed instruction on toileting with deep breath out and low sound      Lumbar Exercises: Stretches   Active Hamstring Stretch  Right;Left;1 rep;30 seconds    Active Hamstring Stretch Limitations  sitting    Piriformis Stretch  Right;Left;1 rep;30 seconds    Piriformis Stretch Limitations  sitting    Other Lumbar Stretch Exercise  butterfly stretch      Manual Therapy   Manual Therapy  Soft tissue mobilization;Myofascial release    Manual therapy comments  educated patient on how to perform her abdominal massage  Soft tissue mobilization  circular massage to promote peristalic motion of the intestines    Myofascial Release  release of the sac of douglas and upper abdomen             PT Education - 07/03/19 1442    Education Details  Access Code: [email or web address]; abdominal massage; toileting; vaginal and anal care for tissue health    Person(s) Educated  Patient    Methods  Explanation;Demonstration;Verbal cues;Handout    Comprehension  Verbalized understanding;Returned demonstration       PT Short Term Goals - 06/07/19 1113      PT SHORT TERM GOAL #1   Title  independent with initial HEP    Time  4    Period  Weeks    Status  New    Target Date  07/05/19      PT SHORT TERM GOAL #2   Title  understand correct toileting technique to reduce strain on the pelvic floor    Time  4    Period  Weeks    Status  New    Target Date  07/05/19      PT SHORT TERM GOAL #3   Title  understand correct abdominal massage to assist in peristalic motion of the intestines and improve bowel  movements    Time  12    Period  Weeks    Status  New    Target Date  08/30/19        PT Long Term Goals - 06/07/19 1114      PT LONG TERM GOAL #1   Title  independent with advanced HEP    Time  12    Period  Weeks    Status  New    Target Date  08/30/19      PT LONG TERM GOAL #2   Title  strain to have a bowel movement with >/= 75% less straining due to ablility to relax the pelvic floor    Time  12    Period  Weeks    Status  New    Target Date  08/30/19      PT LONG TERM GOAL #3   Title  understand vaginal health to improve moisture and around the anal canal    Time  12    Period  Weeks    Status  New    Target Date  08/30/19      PT LONG TERM GOAL #4   Title  understand how to massage around the anus prior to and after a bowel movement to reduce hemorroids and fissures    Time  12    Period  Weeks    Status  New    Target Date  08/30/19      PT LONG TERM GOAL #5   Title  PFIQ-7 score </= 5% due to improvement of constipation    Time  12    Period  Weeks    Status  New    Target Date  08/30/19            Plan - 07/03/19 1443    Clinical Impression Statement  Patient reports the breathing is helping her but she is not able to push the stool out. Patient does better with a deeper and longer breath to increase the pressure for a bowel movement. Patient has some tightness in the lower abdomen and upper middle abdomen. Patient will benefit from skilled therapy to improve coordination of the pelvic floor  msucles to reduce straining to cause the hemorroids and fissure.    Personal Factors and Comorbidities  Comorbidity 2    Comorbidities  IBS; osteoporosis; hemorroids, anal fissure    Examination-Activity Limitations  Toileting    Stability/Clinical Decision Making  Evolving/Moderate complexity    Rehab Potential  Excellent    PT Frequency  1x / week    PT Duration  12 weeks    PT Treatment/Interventions  Biofeedback;Cryotherapy;Electrical Stimulation;Moist  Heat;Neuromuscular re-education;Therapeutic exercise;Therapeutic activities;Patient/family education;Manual techniques;Dry needling    PT Next Visit Plan  work on the left levator and obturator internist externally, work with her feeling her anus for the contraction; massage the anus prior to bowel movement    PT Home Exercise Plan  Access Code: [email or web address]    Recommended Other Services  MD signed initial summary    Consulted and Agree with Plan of Care  Patient       Patient will benefit from skilled therapeutic intervention in order to improve the following deficits and impairments:  Increased fascial restricitons, Decreased endurance, Increased muscle spasms, Pain, Decreased strength  Visit Diagnosis: Muscle weakness (generalized)  Other lack of coordination  Constipation, unspecified constipation type     Problem List Patient Active Problem List   Diagnosis Date Noted  . Loose stools 11/03/2016  . Generalized abdominal pain 11/03/2016  . Enteritis due to Clostridium difficile 09/17/2016  . SVT (supraventricular tachycardia) ([place]) 01/09/2014  . FH: colon cancer 08/18/2010  . Benign neoplasm of colon 08/18/2010  . Diverticula of colon 08/18/2010  . Diverticulitis of colon 08/18/2010  . Hemorrhoids, internal 08/18/2010    Earlie Counts, PT 07/03/19 2:49 PM   [facility] [address], [place], [place], [address] Phone: [phone]   Fax:  [phone]  Name: KAHLYNN OLDAKER MRN: [id number] Date of Birth: 1955-04-19

## 2019-07-04 DIAGNOSIS — Z23 Encounter for immunization: Secondary | ICD-10-CM | POA: Diagnosis not present

## 2019-07-10 ENCOUNTER — Ambulatory Visit: Payer: BC Managed Care – PPO | Attending: Gastroenterology | Admitting: Physical Therapy

## 2019-07-10 ENCOUNTER — Other Ambulatory Visit: Payer: Self-pay

## 2019-07-10 ENCOUNTER — Encounter: Payer: Self-pay | Admitting: Physical Therapy

## 2019-07-10 DIAGNOSIS — M6281 Muscle weakness (generalized): Secondary | ICD-10-CM | POA: Diagnosis not present

## 2019-07-10 DIAGNOSIS — R278 Other lack of coordination: Secondary | ICD-10-CM | POA: Insufficient documentation

## 2019-07-10 DIAGNOSIS — K5900 Constipation, unspecified: Secondary | ICD-10-CM | POA: Insufficient documentation

## 2019-07-10 NOTE — Therapy (Signed)
[facility] [address], [place], [place], [address] Phone: [phone]   Fax:  [phone]  Physical Therapy Treatment  Patient Details  Name: Sheri Sullivan MRN: [id number] Date of Birth: [birth year] Referring Provider (PT): Dr. [place] Cellar   Encounter Date: 07/10/2019  PT End of Session - 07/10/19 0932    Visit Number  3    Date for PT Re-Evaluation  08/30/19    Authorization Type  BCBS    Authorization - Visit Number  3    Authorization - Number of Visits  90    PT Start Time  0930    PT Stop Time  [id number]    PT Time Calculation (min)  38 min    Activity Tolerance  Patient tolerated treatment well;No increased pain    Behavior During Therapy  WFL for tasks assessed/performed       Past Medical History:  Diagnosis Date  . Anxiety   . Arthritis   . Clostridium difficile infection   . Depression   . Diastolic dysfunction   . Diverticulitis   . Diverticulosis   . GERD (gastroesophageal reflux disease)   . History of colon polyps 2017  . Hypercholesterolemia   . Hyperlipidemia   . IBS (irritable bowel syndrome)   . Osteoporosis    osteopenia  . Paroxysmal atrial flutter [facility])     Past Surgical History:  Procedure Laterality Date  . APPENDECTOMY    . BREAST LUMPECTOMY     R breast, benign lump  . COLONOSCOPY    . TUBAL LIGATION      There were no vitals filed for this visit.  Subjective Assessment - 07/10/19 0933    Subjective  Things are going well. It takes concentration with the breathing. the fissure is feeling better. When squeeze the anus the pain is 3/10.    Patient Stated Goals  improve bowel movements, improve anal fissure and hemorroids    Currently in Pain?  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 [place] Cellar                Pelvic Floor Special Questions - 07/10/19 0001    Pelvic Floor Internal Exam  Patient confirms identification and approves PT to assess pelvic floor and treatment    Exam Type  Rectal        [facility] Adult PT Treatment/Exercise - 07/10/19 0001      Self-Care   Self-Care  Other Self-Care Comments    Other Self-Care Comments   massage the anus prior to and after bowel movement to relax the muscles and reduce the pain      Neuro Re-ed    Neuro Re-ed Details   anal contraction with full relaxation holding for 10 seconds with 10  second relaxation , 5 quick flicks with full relaxation,       Manual Therapy   Manual Therapy  Soft tissue mobilization;Myofascial release    Soft tissue mobilization  soft tissue work on the coccygeus, levator ani, around the external anal sphincter    Myofascial Release  release around the peirial body on the left             PT Education - 07/10/19 1008    Education Details  Access Code: [email or web address]    Person(s) Educated  Patient    Methods  Explanation;Demonstration;Verbal cues;Handout    Comprehension  Verbalized understanding;Returned demonstration       PT Short Term Goals - 07/10/19 1015      PT SHORT TERM GOAL #1   Title  independent with initial HEP    Time  4    Period  Weeks    Status  Achieved      PT SHORT TERM GOAL #2   Title  understand correct toileting technique to reduce strain on the pelvic floor    Time  4    Period  Weeks    Status  Achieved      PT SHORT TERM GOAL #3   Title  understand correct abdominal massage to assist in peristalic motion of the  intestines and improve bowel movements    Time  12    Period  Weeks    Status  Achieved        PT Long Term Goals - 06/07/19 1114      PT LONG TERM GOAL #1   Title  independent with advanced HEP    Time  12    Period  Weeks    Status  New    Target Date  08/30/19      PT LONG TERM GOAL #2   Title  strain to have a bowel movement with >/= 75% less straining due to ablility to relax the pelvic floor    Time  12    Period  Weeks    Status  New    Target Date  08/30/19      PT LONG TERM GOAL #3   Title  understand vaginal health to improve moisture and around the anal canal    Time  12    Period  Weeks    Status  New    Target Date  08/30/19      PT LONG TERM GOAL #4   Title  understand how to massage around the anus prior to and after a bowel movement to reduce hemorroids and fissures    Time  12    Period  Weeks    Status  New    Target Date  08/30/19      PT LONG TERM GOAL #5   Title  PFIQ-7 score </= 5% due to improvement of constipation    Time  12    Period  Weeks    Status  New    Target Date  08/30/19            Plan - 07/10/19 1010    Clinical Impression Statement  Patient reports 3/10 pain in the anal area after a bowel movement. Patient  reports she is able to have a bowel movement wtih greater ease. Patient has some fascial tightness in the left anal area, levator ani and coccygeus. Patient understands to do her pelvic floor contraction after a bowel movement to reduce the feeling of having  to go again. Patient understands to massage the anus before and after bowel movement to relax the muscles and reduce the pain afterwards. Patient will benefit from skilled therapy to improv ecoordination of the pelvic floor muscles to reduce straining to cause the hemorroids and fissure.    Personal Factors and Comorbidities  Comorbidity 2    Comorbidities  IBS; osteoporosis; hemorroids, anal fissure    Examination-Activity Limitations  Toileting     Stability/Clinical Decision Making  Evolving/Moderate complexity    Rehab Potential  Excellent    PT Frequency  1x / week    PT Duration  12 weeks    PT Treatment/Interventions  Biofeedback;Cryotherapy;Electrical Stimulation;Moist Heat;Neuromuscular re-education;Therapeutic exercise;Therapeutic activities;Patient/family education;Manual techniques;Dry needling    PT Next Visit Plan  review HEP, see if she needs to continue, go over goals    PT Home Exercise Plan  Access Code: [email or web address]    Consulted and Agree with Plan of Care  Patient       Patient will benefit from skilled therapeutic intervention in order to improve the following deficits and impairments:  Increased fascial restricitons, Decreased endurance, Increased muscle spasms, Pain, Decreased strength  Visit Diagnosis: Muscle weakness (generalized)  Other lack of coordination  Constipation, unspecified constipation type     Problem List Patient Active Problem List   Diagnosis Date Noted  . Loose stools 11/03/2016  . Generalized abdominal pain 11/03/2016  . Enteritis due to Clostridium difficile 09/17/2016  . SVT (supraventricular tachycardia) ([place]) 01/09/2014  . FH: colon cancer 08/18/2010  . Benign neoplasm of colon 08/18/2010  . Diverticula of colon 08/18/2010  . Diverticulitis of colon 08/18/2010  . Hemorrhoids, internal 08/18/2010    Earlie Counts, PT 07/10/19 10:16 AM   [facility] [address], [place], [place], [address] Phone: [phone]   Fax:  [phone]  Name: Sheri Sullivan MRN: [id number] Date of Birth: 05-18-1955

## 2019-07-10 NOTE — Patient Instructions (Signed)
Access Code: [email or web address] URL: [email or web address] Date: 07/10/2019 Prepared by: Earlie Counts  Exercises Seated Hamstring Stretch - 1 x daily - 7 x weekly - 1 sets - 2 reps - 30 sec hold Seated Piriformis Stretch with Trunk Bend - 1 x daily - 7 x weekly - 1 sets - 2 reps - 30 sec hold Supine Butterfly Groin Stretch - 1 x daily - 7 x weekly - 1 sets - 1 reps - 1 minute hold Seated Pelvic Floor Contraction - 3 x daily - 7 x weekly - 10 reps - 10 hold [facility] [address], [place], [place] [address] Phone # [phone] Fax [phone]

## 2019-07-19 ENCOUNTER — Other Ambulatory Visit: Payer: Self-pay

## 2019-07-19 ENCOUNTER — Encounter: Payer: Self-pay | Admitting: Physical Therapy

## 2019-07-19 ENCOUNTER — Ambulatory Visit: Payer: BC Managed Care – PPO | Admitting: Physical Therapy

## 2019-07-19 DIAGNOSIS — K5900 Constipation, unspecified: Secondary | ICD-10-CM | POA: Diagnosis not present

## 2019-07-19 DIAGNOSIS — M6281 Muscle weakness (generalized): Secondary | ICD-10-CM | POA: Diagnosis not present

## 2019-07-19 DIAGNOSIS — R278 Other lack of coordination: Secondary | ICD-10-CM | POA: Diagnosis not present

## 2019-07-19 NOTE — Therapy (Signed)
[facility] [address] [place], [place], [address] Phone: [phone]   Fax:  [phone]  Physical Therapy Treatment  Patient Details  Name: Sheri Sullivan MRN: [id number] Date of Birth: [birth year] Referring Provider (PT): Dr. [place] Cellar   Encounter Date: 07/19/2019  PT End of Session - 07/19/19 1440    Visit Number  4    Date for PT Re-Evaluation  08/30/19    Authorization Type  BCBS    Authorization - Visit Number  4    Authorization - Number of Visits  90    PT Start Time  1400    PT Stop Time  6222    PT Time Calculation (min)  38 min    Activity Tolerance  Patient tolerated treatment well;No increased pain    Behavior During Therapy  WFL for tasks assessed/performed       Past Medical History:  Diagnosis Date  . Anxiety   . Arthritis   . Clostridium difficile infection   . Depression   . Diastolic dysfunction   . Diverticulitis   . Diverticulosis   . GERD (gastroesophageal reflux disease)   . History of colon polyps 2017  . Hypercholesterolemia   . Hyperlipidemia   . IBS (irritable bowel syndrome)   . Osteoporosis    osteopenia  . Paroxysmal atrial flutter [facility])     Past Surgical History:  Procedure Laterality Date  . APPENDECTOMY    . BREAST LUMPECTOMY     R breast, benign lump  . COLONOSCOPY    . TUBAL LIGATION      There were no vitals filed for this visit.  Subjective Assessment - 07/19/19 1402    Subjective  I ate raw spinach and it went through me. the fissure is feeling better. When I contract the anus the pain is 0.5/10.    Patient Stated Goals  improve bowel movements, improve anal fissure and hemorroids    Currently in Pain?  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 [place] Cellar    Prior Therapy  none      Precautions   Precautions  Other (comment)    Precaution Comments  osteoporosis      Cognition   Overall Cognitive Status  Within Functional Limits for tasks assessed      Posture/Postural Control   Posture/Postural Control  No significant limitations                Pelvic Floor Special Questions - 07/19/19 0001    Strength  fair squeeze, definite lift        [facility] Adult PT Treatment/Exercise - 07/19/19 0001      Self-Care   Self-Care  Other Self-Care Comments    Other Self-Care Comments  double voiding to fully empty her bladder and relax ; reviewed ways to take care of vaginal health      Neuro Re-ed    Neuro Re-ed Details   anal contraction with the lower abdominals engaging and isolating to the anus; concentrating on contracting the vaginal with a circular contraction cmpared to just the sides      Lumbar Exercises: Supine   Ab Set  10 reps;5 seconds    Bent Knee Raise  20 reps;1 second    Dead Bug  20 reps;1 second    Bridge  20 reps;1 second             PT Education - 07/19/19 1438    Education Details  Access Code: [email or web address]; ways to double void    Person(s) Educated  Patient    Methods  Explanation;Demonstration;Verbal cues;Handout    Comprehension  Returned demonstration;Verbalized understanding       PT Short Term Goals - 07/10/19 1015      PT SHORT TERM GOAL #1   Title  independent with initial HEP    Time  4    Period  Weeks    Status  Achieved      PT SHORT TERM GOAL #2   Title  understand correct toileting technique to reduce strain on the pelvic floor     Time  4    Period  Weeks    Status  Achieved      PT SHORT TERM GOAL #3   Title  understand correct abdominal massage to assist in peristalic motion of the intestines and improve bowel movements    Time  12    Period  Weeks    Status  Achieved        PT Long Term Goals - 07/19/19 1404      PT LONG TERM GOAL #1   Title  independent with advanced HEP    Time  12    Period  Weeks    Status  Achieved      PT LONG TERM GOAL #2   Title  strain to have a bowel movement with >/= 75% less straining due to ablility to relax the pelvic floor    Time  12    Period  Weeks    Status  Achieved      PT LONG TERM GOAL #3   Title  understand vaginal health to improve moisture and around the anal canal    Time  12    Period  Weeks    Status  Achieved      PT LONG TERM GOAL #4   Title  understand how to massage around the anus prior to and after a bowel movement to reduce hemorroids and fissures    Time  12    Period  Weeks    Status  Achieved      PT LONG TERM GOAL #5   Status  Deferred            Plan - 07/19/19 1418    Clinical Impression Statement  Patient reports straining to go to the bathroom is 90% better. Patient reports when she squeezes her anus the pain level is 0.5/10. Patient understands how to take care to the vaginal and anal skin care. Patient has learned how to toilet correctly and abdominal massage. Patient has learned core strength and correctly contract the pelvic floor.    Personal Factors and Comorbidities  Comorbidity 2    Comorbidities  IBS;  osteoporosis; hemorroids, anal fissure    Examination-Activity Limitations  Toileting    Stability/Clinical Decision Making  Evolving/Moderate complexity    Rehab Potential  Excellent    PT Frequency  1x / week    PT Duration  12 weeks    PT Treatment/Interventions  Biofeedback;Cryotherapy;Electrical Stimulation;Moist Heat;Neuromuscular re-education;Therapeutic exercise;Therapeutic activities;Patient/family  education;Manual techniques;Dry needling    PT Next Visit Plan  discharge to HEP    PT Home Exercise Plan  Access Code: [email or web address]    Consulted and Agree with Plan of Care  Patient       Patient will benefit from skilled therapeutic intervention in order to improve the following deficits and impairments:  Increased fascial restricitons, Decreased endurance, Increased muscle spasms, Pain, Decreased strength  Visit Diagnosis: Muscle weakness (generalized)  Other lack of coordination  Constipation, unspecified constipation type     Problem List Patient Active Problem List   Diagnosis Date Noted  . Loose stools 11/03/2016  . Generalized abdominal pain 11/03/2016  . Enteritis due to Clostridium difficile 09/17/2016  . SVT (supraventricular tachycardia) ([place]) 01/09/2014  . FH: colon cancer 08/18/2010  . Benign neoplasm of colon 08/18/2010  . Diverticula of colon 08/18/2010  . Diverticulitis of colon 08/18/2010  . Hemorrhoids, internal 08/18/2010    Earlie Counts, PT 07/19/19 2:44 PM    [facility] [address] [place], [place], [address] Phone: [phone]   Fax:  [phone]  Name: INDIYAH PAONE MRN: [id number] Date of Birth: 07-09-1955 PHYSICAL THERAPY DISCHARGE SUMMARY  Visits from Start of Care: 4  Current functional level related to goals / functional outcomes: See above.    Remaining deficits: See above.    Education / Equipment: HEP Plan: Patient agrees to discharge.  Patient goals were met. Patient is being discharged due to meeting the stated rehab goals.  Thank you for the referral. Earlie Counts, PT 07/19/19 2:45 PM  ?????

## 2019-07-19 NOTE — Patient Instructions (Addendum)
Double Voiding can be a very useful technique to help overcome incomplete emptying of your bladder.  Incomplete emptying of urine can result in leakage after using the bathroom and increase the risk of urinary tract infection.   Initial Void: When you first sit down to urinate, ensure optimal positioning for bladder emptying by following these guidelines for toileting posture: Sit on the toilet seat - don't hover over the seat Support your trunk by placing your hands on your knees or thighs Spread your knees and hips wide Position your feet flat on the floor or elevate feet on phone books, foot stool (Squatty Potty), or wrapped toilet paper rolls (if having knees above hips helps you empty) Lean forward from your hips Maintain the normal inward curve in your lower back   Repeated Void: After your initial void is complete, follow these movement patterns and attempt going to the bathroom again. Stand up Rotate your hips as if doing hula hoop in one direction Rotate using the same action in the other direction Rock your hips and pelvis back and forwards ("pelvic tilts") Rock your hips and pelvis side to side ("tail wag") Sit back down and repeat your voiding technique This technique can be repeated as many times as you choose to help you empty your bladder more effectively. [facility] [address] [place], [place] [address] Phone # [phone] Fax [phone]  Access Code: [email or web address] URL: [email or web address] Date: 07/19/2019 Prepared by: Earlie Counts  Exercises Seated Hamstring Stretch - 1 x daily - 7 x weekly - 1 sets - 2 reps - 30 sec hold Seated Piriformis Stretch with Trunk Bend - 1 x daily - 7 x weekly - 1 sets - 2 reps - 30 sec hold Supine Butterfly Groin Stretch - 1 x daily - 7 x weekly - 1 sets - 1 reps - 1 minute hold Seated Pelvic Floor Contraction - 3 x daily - 7 x weekly - 10 reps - 10 hold Supine Pelvic Floor Contraction - 1 x  daily - 7 x weekly - 1 sets - 10 reps - 5 sec hold Supine March - 1 x daily - 7 x weekly - 1 sets - 10 reps Dead Bug - 1 x daily - 7 x weekly - 1 sets - 20 reps Supine Bridge - 1 x daily - 7 x weekly - 1 sets - 15 reps

## 2019-07-24 ENCOUNTER — Encounter: Payer: BC Managed Care – PPO | Admitting: Physical Therapy

## 2019-07-25 ENCOUNTER — Ambulatory Visit: Payer: BC Managed Care – PPO | Attending: Internal Medicine

## 2019-07-25 DIAGNOSIS — Z23 Encounter for immunization: Secondary | ICD-10-CM

## 2019-07-25 NOTE — Progress Notes (Signed)
   Covid-19 Vaccination Clinic  Name:  Sheri Sullivan    MRN: [id number] DOB: 10-Nov-1955  07/25/2019  [name] was observed post Covid-19 immunization for 15 minutes without incident. She was provided with Vaccine Information Sheet and instruction to access the V-Safe system.   [name] was instructed to call 911 with any severe reactions post vaccine: Marland Kitchen Difficulty breathing  . Swelling of face and throat  . A fast heartbeat  . A bad rash all over body  . Dizziness and weakness   Immunizations Administered    Name Date Dose VIS Date Route   Pfizer COVID-19 Vaccine 07/25/2019 12:01 PM 0.3 mL 05/31/2018 Intramuscular   Manufacturer: [place]   Lot: [id number]   [place]: [id number]

## 2019-10-31 DIAGNOSIS — Z8619 Personal history of other infectious and parasitic diseases: Secondary | ICD-10-CM | POA: Diagnosis not present

## 2019-10-31 DIAGNOSIS — R195 Other fecal abnormalities: Secondary | ICD-10-CM | POA: Diagnosis not present

## 2019-10-31 DIAGNOSIS — R109 Unspecified abdominal pain: Secondary | ICD-10-CM | POA: Diagnosis not present

## 2019-11-01 DIAGNOSIS — R195 Other fecal abnormalities: Secondary | ICD-10-CM | POA: Diagnosis not present

## 2019-11-09 DIAGNOSIS — Z23 Encounter for immunization: Secondary | ICD-10-CM | POA: Diagnosis not present

## 2020-01-13 DIAGNOSIS — Z23 Encounter for immunization: Secondary | ICD-10-CM | POA: Diagnosis not present

## 2020-01-26 DIAGNOSIS — H25042 Posterior subcapsular polar age-related cataract, left eye: Secondary | ICD-10-CM | POA: Diagnosis not present

## 2020-01-26 DIAGNOSIS — H2513 Age-related nuclear cataract, bilateral: Secondary | ICD-10-CM | POA: Diagnosis not present

## 2020-01-26 DIAGNOSIS — D4981 Neoplasm of unspecified behavior of retina and choroid: Secondary | ICD-10-CM | POA: Diagnosis not present

## 2020-01-26 DIAGNOSIS — H43812 Vitreous degeneration, left eye: Secondary | ICD-10-CM | POA: Diagnosis not present

## 2020-04-17 DIAGNOSIS — Z1231 Encounter for screening mammogram for malignant neoplasm of breast: Secondary | ICD-10-CM | POA: Diagnosis not present

## 2020-04-17 DIAGNOSIS — Z01419 Encounter for gynecological examination (general) (routine) without abnormal findings: Secondary | ICD-10-CM | POA: Diagnosis not present

## 2020-05-13 ENCOUNTER — Other Ambulatory Visit: Payer: Self-pay | Admitting: Family Medicine

## 2020-05-13 DIAGNOSIS — R1031 Right lower quadrant pain: Secondary | ICD-10-CM

## 2020-05-13 DIAGNOSIS — Z0000 Encounter for general adult medical examination without abnormal findings: Secondary | ICD-10-CM | POA: Diagnosis not present

## 2020-05-13 DIAGNOSIS — R319 Hematuria, unspecified: Secondary | ICD-10-CM | POA: Diagnosis not present

## 2020-05-13 DIAGNOSIS — E7800 Pure hypercholesterolemia, unspecified: Secondary | ICD-10-CM | POA: Diagnosis not present

## 2020-05-13 DIAGNOSIS — Z862 Personal history of diseases of the blood and blood-forming organs and certain disorders involving the immune mechanism: Secondary | ICD-10-CM | POA: Diagnosis not present

## 2020-05-13 DIAGNOSIS — M8580 Other specified disorders of bone density and structure, unspecified site: Secondary | ICD-10-CM

## 2020-05-21 DIAGNOSIS — Z8349 Family history of other endocrine, nutritional and metabolic diseases: Secondary | ICD-10-CM | POA: Diagnosis not present

## 2020-06-03 ENCOUNTER — Ambulatory Visit
Admission: RE | Admit: 2020-06-03 | Discharge: 2020-06-03 | Disposition: A | Discharge status: Home / Self Care | Payer: BC Managed Care – PPO | Source: Ambulatory Visit | Attending: Family Medicine | Admitting: Family Medicine

## 2020-06-03 DIAGNOSIS — N281 Cyst of kidney, acquired: Secondary | ICD-10-CM | POA: Diagnosis not present

## 2020-06-03 DIAGNOSIS — I700 Atherosclerosis of aorta: Secondary | ICD-10-CM | POA: Diagnosis not present

## 2020-06-03 DIAGNOSIS — R1031 Right lower quadrant pain: Secondary | ICD-10-CM

## 2020-06-03 MED ORDER — IOPAMIDOL (ISOVUE-300) INJECTION 61%
100.0000 mL | Freq: Once | INTRAVENOUS | Status: AC | PRN
  Administered 2020-06-03: 100 mL via INTRAVENOUS

## 2020-07-16 ENCOUNTER — Encounter: Payer: Self-pay | Admitting: Gastroenterology

## 2020-08-27 DIAGNOSIS — M7712 Lateral epicondylitis, left elbow: Secondary | ICD-10-CM | POA: Diagnosis not present

## 2020-09-30 ENCOUNTER — Ambulatory Visit (AMBULATORY_SURGERY_CENTER): Payer: BC Managed Care – PPO | Admitting: *Deleted

## 2020-09-30 ENCOUNTER — Other Ambulatory Visit: Payer: Self-pay

## 2020-09-30 VITALS — Ht 68.0 in | Wt 167.0 lb

## 2020-09-30 DIAGNOSIS — Z860100 Personal history of colon polyps, unspecified: Secondary | ICD-10-CM

## 2020-09-30 DIAGNOSIS — Z800 Family history of malignant neoplasm of digestive organs: Secondary | ICD-10-CM

## 2020-09-30 MED ORDER — NA SULFATE-K SULFATE-MG SULF 17.5-3.13-1.6 GM/177ML PO SOLN: ORAL | 0 refills | Status: DC

## 2020-09-30 NOTE — Progress Notes (Signed)
Patient's pre-visit was done today over the phone with the patient due to COVID-19 pandemic. Name,DOB and address verified. Insurance verified. Patient denies any allergies to Eggs and Soy. Patient denies any problems with anesthesia/sedation. Patient denies taking diet pills or blood thinners. Packet of Prep instructions mailed to patient including a copy of a consent form-pt is aware. Patient understands to call us back with any questions or concerns. Patient is aware of our care-partner policy and BOFBP-10 safety protocol.   EMMI education assigned to the patient for the procedure, sent to [facility].   The patient is COVID-19 vaccinated, per patient.

## 2020-10-01 DIAGNOSIS — M7712 Lateral epicondylitis, left elbow: Secondary | ICD-10-CM | POA: Diagnosis not present

## 2020-10-03 ENCOUNTER — Other Ambulatory Visit: Payer: Self-pay

## 2020-10-03 ENCOUNTER — Ambulatory Visit
Admission: RE | Admit: 2020-10-03 | Discharge: 2020-10-03 | Disposition: A | Discharge status: Home / Self Care | Payer: BC Managed Care – PPO | Source: Ambulatory Visit | Attending: Family Medicine | Admitting: Family Medicine

## 2020-10-03 DIAGNOSIS — Z780 Asymptomatic menopausal state: Secondary | ICD-10-CM | POA: Diagnosis not present

## 2020-10-03 DIAGNOSIS — M8580 Other specified disorders of bone density and structure, unspecified site: Secondary | ICD-10-CM

## 2020-10-03 DIAGNOSIS — M8589 Other specified disorders of bone density and structure, multiple sites: Secondary | ICD-10-CM | POA: Diagnosis not present

## 2020-10-11 ENCOUNTER — Other Ambulatory Visit: Payer: Self-pay

## 2020-10-11 ENCOUNTER — Ambulatory Visit (AMBULATORY_SURGERY_CENTER): Payer: BC Managed Care – PPO | Admitting: Gastroenterology

## 2020-10-11 ENCOUNTER — Encounter: Payer: Self-pay | Admitting: Gastroenterology

## 2020-10-11 VITALS — BP 129/82 | HR 57 | Temp 98.4°F | Resp 12 | Ht 68.0 in | Wt 167.0 lb

## 2020-10-11 DIAGNOSIS — K635 Polyp of colon: Secondary | ICD-10-CM | POA: Diagnosis not present

## 2020-10-11 DIAGNOSIS — Z800 Family history of malignant neoplasm of digestive organs: Secondary | ICD-10-CM

## 2020-10-11 DIAGNOSIS — Z86010 Personal history of colonic polyps: Secondary | ICD-10-CM

## 2020-10-11 DIAGNOSIS — Z1211 Encounter for screening for malignant neoplasm of colon: Secondary | ICD-10-CM | POA: Diagnosis not present

## 2020-10-11 DIAGNOSIS — D124 Benign neoplasm of descending colon: Secondary | ICD-10-CM

## 2020-10-11 MED ORDER — SODIUM CHLORIDE 0.9 % IV SOLN: 500.0000 mL | Freq: Once | INTRAVENOUS | Status: DC

## 2020-10-11 NOTE — Progress Notes (Signed)
A/ox3, pleased with MAC, report to RN 

## 2020-10-11 NOTE — Progress Notes (Signed)
Pt's states no medical or surgical changes since previsit or office visit. 

## 2020-10-11 NOTE — Progress Notes (Signed)
Called to room to assist during endoscopic procedure.  Patient ID and intended procedure confirmed with present staff. Received instructions for my participation in the procedure from the performing physician.  

## 2020-10-11 NOTE — Op Note (Signed)
[facility] Patient Name: Sheri Sullivan Procedure Date: 10/11/2020 10:31 AM MRN: [id number] Endoscopist: Remo Lipps P. Havery Moros , MD Age: 65 Referring MD:  Date of Birth: May 03, 1955 Gender: Female Account #: [id number] Procedure:                Colonoscopy Indications:              High risk colon cancer surveillance: Personal                            history of colonic polyps (09/2015 - 2 adenomas),                            mother with colon cancer around age 71 Medicines:                Monitored Anesthesia Care Procedure:                Pre-Anesthesia Assessment:                           - Prior to the procedure, a History and Physical                            was performed, and patient medications and                            allergies were reviewed. The patient's tolerance of                            previous anesthesia was also reviewed. The risks                            and benefits of the procedure and the sedation                            options and risks were discussed with the patient.                            All questions were answered, and informed consent                            was obtained. Prior Anticoagulants: The patient has                            taken no previous anticoagulant or antiplatelet                            agents. ASA Grade Assessment: II - A patient with                            mild systemic disease. After reviewing the risks                            and benefits, the patient was deemed in  satisfactory condition to undergo the procedure.                           After obtaining informed consent, the colonoscope                            was passed under direct vision. Throughout the                            procedure, the patient's blood pressure, pulse, and                            oxygen saturations were monitored continuously. The                            Olympus  [id number]-[id number] (#[id number]) Colonoscope was                            introduced through the anus and advanced to the the                            cecum, identified by appendiceal orifice and                            ileocecal valve. The colonoscopy was performed                            without difficulty. The patient tolerated the                            procedure well. The quality of the bowel                            preparation was adequate. The ileocecal valve,                            appendiceal orifice, and rectum were photographed. Scope In: 10:47:44 AM Scope Out: 11:14:15 AM Scope Withdrawal Time: 0 hours 14 minutes 42 seconds  Total Procedure Duration: 0 hours 26 minutes 31 seconds  Findings:                 The perianal and digital rectal examinations were                            normal.                           Two flat polyps were found in the descending colon.                            The polyps were 3 to 4 mm in size. These polyps                            were removed with a cold snare. Resection and  retrieval were complete.                           Multiple medium-mouthed diverticula were found in                            the transverse colon and left colon.                           Internal hemorrhoids were found during retroflexion.                           The colon was quite tortuous with looping, cecal                            intubation was challenging.                           The exam was otherwise without abnormality. Complications:            No immediate complications. Estimated blood loss:                            Minimal. Estimated Blood Loss:     Estimated blood loss was minimal. Impression:               - Two 3 to 4 mm polyps in the descending colon,                            removed with a cold snare. Resected and retrieved.                           - Diverticulosis in the transverse colon and in  the                            left colon.                           - Internal hemorrhoids.                           - Tortuous colon.                           - The examination was otherwise normal. Recommendation:           - Patient has a contact number available for                            emergencies. The signs and symptoms of potential                            delayed complications were discussed with the                            patient. Return to normal activities tomorrow.  Written discharge instructions were provided to the                            patient.                           - Resume previous diet.                           - Continue present medications.                           - Await pathology results. Remo Lipps P. Chelly Dombeck, MD 10/11/2020 11:18:59 AM This report has been signed electronically.

## 2020-10-11 NOTE — Patient Instructions (Signed)
Handouts given:  Polyps, diverticulosis, Hemorrhoids Resume previous diet Continue current medications Await pathology results  YOU HAD AN ENDOSCOPIC PROCEDURE TODAY AT [facility]:   Refer to the procedure report that was given to you for any specific questions about what was found during the examination.  If the procedure report does not answer your questions, please call your gastroenterologist to clarify.  If you requested that your care partner not be given the details of your procedure findings, then the procedure report has been included in a sealed envelope for you to review at your convenience later.  YOU SHOULD EXPECT: Some feelings of bloating in the abdomen. Passage of more gas than usual.  Walking can help get rid of the air that was put into your GI tract during the procedure and reduce the bloating. If you had a lower endoscopy (such as a colonoscopy or flexible sigmoidoscopy) you may notice spotting of blood in your stool or on the toilet paper. If you underwent a bowel prep for your procedure, you may not have a normal bowel movement for a few days.  Please Note:  You might notice some irritation and congestion in your nose or some drainage.  This is from the oxygen used during your procedure.  There is no need for concern and it should clear up in a day or so.  SYMPTOMS TO REPORT IMMEDIATELY:  Following lower endoscopy (colonoscopy or flexible sigmoidoscopy):  Excessive amounts of blood in the stool  Significant tenderness or worsening of abdominal pains  Swelling of the abdomen that is new, acute  Fever of 100F or higher For urgent or emergent issues, a gastroenterologist can be reached at any hour by calling [phone]. Do not use MyChart messaging for urgent concerns.   DIET:  We do recommend a small meal at first, but then you may proceed to your regular diet.  Drink plenty of fluids but you should avoid alcoholic beverages for 24 hours.  ACTIVITY:   You should plan to take it easy for the rest of today and you should NOT DRIVE or use heavy machinery until tomorrow (because of the sedation medicines used during the test).    FOLLOW UP: Our staff will call the number listed on your records 48-72 hours following your procedure to check on you and address any questions or concerns that you may have regarding the information given to you following your procedure. If we do not reach you, we will leave a message.  We will attempt to reach you two times.  During this call, we will ask if you have developed any symptoms of COVID 19. If you develop any symptoms (ie: fever, flu-like symptoms, shortness of breath, cough etc.) before then, please call [phone].  If you test positive for Covid 19 in the 2 weeks post procedure, please call and report this information to [place].    If any biopsies were taken you will be contacted by phone or by letter within the next 1-3 weeks.  Please call us at [phone] if you have not heard about the biopsies in 3 weeks.   SIGNATURES/CONFIDENTIALITY: You and/or your care partner have signed paperwork which will be entered into your electronic medical record.  These signatures attest to the fact that that the information above on your After Visit Summary has been reviewed and is understood.  Full responsibility of the confidentiality of this discharge information lies with you and/or your care-partner.

## 2020-10-15 ENCOUNTER — Telehealth: Payer: Self-pay | Admitting: *Deleted

## 2020-10-15 NOTE — Telephone Encounter (Signed)
  Follow up Call-  Call back number 10/11/2020  Post procedure Call Back phone  # [phone]  Permission to leave phone message Yes  Some recent data might be hidden     Patient questions:  Do you have a fever, pain , or abdominal swelling? No. Pain Score  0 *  Have you tolerated food without any problems? Yes.    Have you been able to return to your normal activities? Yes.    Do you have any questions about your discharge instructions: Diet   No. Medications  No. Follow up visit  No.  Do you have questions or concerns about your Care? No.  Actions: * If pain score is 4 or above: No action needed, pain <4.  Have you developed a fever since your procedure? no  2.   Have you had an respiratory symptoms (SOB or cough) since your procedure? no  3.   Have you tested positive for COVID 19 since your procedure no  4.   Have you had any family members/close contacts diagnosed with the COVID 19 since your procedure?  no   If yes to any of these questions please route to Joylene John, RN and Joella Prince, RN

## 2021-02-03 ENCOUNTER — Other Ambulatory Visit: Payer: Self-pay

## 2021-02-03 ENCOUNTER — Ambulatory Visit (INDEPENDENT_AMBULATORY_CARE_PROVIDER_SITE_OTHER): Payer: BC Managed Care – PPO | Admitting: Podiatry

## 2021-02-03 ENCOUNTER — Ambulatory Visit (INDEPENDENT_AMBULATORY_CARE_PROVIDER_SITE_OTHER): Payer: BC Managed Care – PPO

## 2021-02-03 DIAGNOSIS — M722 Plantar fascial fibromatosis: Secondary | ICD-10-CM

## 2021-02-03 MED ORDER — METHYLPREDNISOLONE 4 MG PO TBPK: ORAL_TABLET | ORAL | 0 refills | Status: AC

## 2021-02-03 MED ORDER — DICLOFENAC SODIUM 75 MG PO TBEC: 75.0000 mg | DELAYED_RELEASE_TABLET | Freq: Two times a day (BID) | ORAL | 1 refills | Status: AC

## 2021-02-03 MED ORDER — BETAMETHASONE SOD PHOS & ACET 6 (3-3) MG/ML IJ SUSP
3.0000 mg | Freq: Once | INTRAMUSCULAR | Status: AC
  Administered 2021-02-03: 3 mg via INTRA_ARTICULAR

## 2021-02-03 NOTE — Progress Notes (Signed)
   Subjective: 65 y.o. female presenting today as a new patient for evaluation of right heel pain is been going on for about 1 year now.  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  She has not anything for treatment.  In the past she has had some OTC arch supports which she states did not help.  She feels that the support is not in the correct place.  She presents for further treatment evaluation   Past Medical History:  Diagnosis Date   Anxiety    Arthritis    Clostridium difficile infection    Depression    Diastolic dysfunction    Diverticulitis    Diverticulosis    GERD (gastroesophageal reflux disease)    Heart murmur    History of colon polyps 2017   Hypercholesterolemia    Hyperlipidemia    IBS (irritable bowel syndrome)    Osteoporosis    osteopenia   Paroxysmal atrial flutter ([facility])      Objective: Physical Exam General: The patient is alert and oriented x3 in no acute distress.  Dermatology: Skin is warm, dry and supple bilateral lower extremities. Negative for open lesions or macerations bilateral.   Vascular: Dorsalis Pedis and Posterior Tibial pulses palpable bilateral.  Capillary fill time is immediate to all digits.  Neurological: Epicritic and protective threshold intact bilateral.   Musculoskeletal: Tenderness to palpation to the plantar aspect of the right heel along the plantar fascia. All other joints range of motion within normal limits bilateral. Strength 5/5 in all groups bilateral.   Radiographic exam: Normal osseous mineralization. Joint spaces preserved. No fracture/dislocation/boney destruction. No other soft tissue abnormalities or radiopaque foreign bodies.   Assessment: 1. Plantar fasciitis right  Plan of Care:  1. Patient evaluated. Xrays reviewed.   2. Injection of 0.5cc Celestone soluspan injected into the right plantar fascia  3. Rx for  Medrol Dose Pack placed 4. Rx for diclofenac 75 mg ordered for patient.  Patient has not had any success in the past with meloxicam 5. Plantar fascial band(s) dispensed 6. Instructed patient regarding therapies and modalities at home to alleviate symptoms.  7.  Today the patient was molded for custom molded orthotics  8.  return to clinic in 4 weeks for orthotic pickup and follow-up evaluation  *works part-time at [employer]   Edrick Kins, DPM [facility]  Dr. Edrick Kins, DPM    [address], [place] [address]                Office [phone]  Fax [phone]

## 2021-02-11 DIAGNOSIS — H43813 Vitreous degeneration, bilateral: Secondary | ICD-10-CM | POA: Diagnosis not present

## 2021-02-11 DIAGNOSIS — D4981 Neoplasm of unspecified behavior of retina and choroid: Secondary | ICD-10-CM | POA: Diagnosis not present

## 2021-02-11 DIAGNOSIS — H25812 Combined forms of age-related cataract, left eye: Secondary | ICD-10-CM | POA: Diagnosis not present

## 2021-02-11 DIAGNOSIS — H2511 Age-related nuclear cataract, right eye: Secondary | ICD-10-CM | POA: Diagnosis not present

## 2021-02-25 ENCOUNTER — Telehealth: Payer: Self-pay | Admitting: Podiatry

## 2021-02-25 NOTE — Telephone Encounter (Signed)
Orthotics in ... lvm for pt to call to schedule an appt to pick them up and to reschedule her appt with Dr Amalia Hailey on 11.30. If possible do both appt on same day.

## 2021-03-05 ENCOUNTER — Ambulatory Visit: Payer: BC Managed Care – PPO | Admitting: Podiatry

## 2021-03-10 ENCOUNTER — Ambulatory Visit (INDEPENDENT_AMBULATORY_CARE_PROVIDER_SITE_OTHER): Payer: BC Managed Care – PPO | Admitting: Podiatry

## 2021-03-10 ENCOUNTER — Other Ambulatory Visit: Payer: Self-pay

## 2021-03-10 DIAGNOSIS — M722 Plantar fascial fibromatosis: Secondary | ICD-10-CM | POA: Diagnosis not present

## 2021-03-10 NOTE — Progress Notes (Signed)
   Subjective: 65 y.o. female presenting today for follow-up evaluation of right heel pain is been going on for about 1 year now.  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  In the past she has had some OTC arch supports which she states did not help.  She feels that the support is not in the correct place.   Last visit on 02/03/2021 she received an injection with a prednisone pack and diclofenac.  She says the injection and prednisone and diclofenac helped significantly.  She continues to wear the plantar fascial brace.  She was also molded for custom orthotics last visit.  She presents for further treatment evaluation.  Overall she does say that there is some significant improvement however she still has some residual pain she presents for further treatment evaluation   Past Medical History:  Diagnosis Date   Anxiety    Arthritis    Clostridium difficile infection    Depression    Diastolic dysfunction    Diverticulitis    Diverticulosis    GERD (gastroesophageal reflux disease)    Heart murmur    History of colon polyps 2017   Hypercholesterolemia    Hyperlipidemia    IBS (irritable bowel syndrome)    Osteoporosis    osteopenia   Paroxysmal atrial flutter ([facility])      Objective: Physical Exam General: The patient is alert and oriented x3 in no acute distress.  Dermatology: Skin is warm, dry and supple bilateral lower extremities. Negative for open lesions or macerations bilateral.   Vascular: Dorsalis Pedis and Posterior Tibial pulses palpable bilateral.  Capillary fill time is immediate to all digits.  Neurological: Epicritic and protective threshold intact bilateral.   Musculoskeletal: There continues to be some tenderness to palpation to the plantar aspect of the right heel along the plantar fascia. All other joints range of motion within normal limits bilateral. Strength 5/5  in all groups bilateral.    Assessment: 1. Plantar fasciitis right  Plan of Care:  1. Patient evaluated.  2.  Patient gets her custom molded orthotics dispensed tomorrow.  Hopefully this will help to support the arches of her foot and alleviate some pressure from the heel 3.  Continue diclofenac 75 mg 2 times daily as needed 4.  Continue plantar fascial brace as needed 5.  Return to clinic as needed after tomorrow to pick up the orthotics  *works part-time at [employer]   Edrick Kins, DPM [facility]  Dr. Edrick Kins, DPM    [address], [place] [address]                Office [phone]  Fax [phone]

## 2021-03-11 ENCOUNTER — Ambulatory Visit: Payer: BC Managed Care – PPO

## 2021-03-11 DIAGNOSIS — M722 Plantar fascial fibromatosis: Secondary | ICD-10-CM

## 2021-03-11 NOTE — Progress Notes (Signed)
SITUATION: Reason for Visit: Fitting and Delivery of Custom Fabricated Foot Orthoses Patient Report: Patient reports comfort and is satisfied with device.  OBJECTIVE DATA: Patient History / Diagnosis:  No change in pathology Provided Device:  Custom functional foot orthoses  GOAL OF ORTHOSIS - Improve gait - Decrease energy expenditure - Improve Balance - Provide Triplanar stability of foot complex - Facilitate motion  ACTIONS PERFORMED Patient was fit with foot orthoses trimmed to shoe last. Patient tolerated fittign procedure. Device was modified as follows to better fit patient: - Toe plate was trimmed to shoe last  Patient was provided with verbal and written instruction and demonstration regarding donning, doffing, wear, care, proper fit, function, purpose, cleaning, and use of the orthosis and in all related precautions and risks and benefits regarding the orthosis.  Patient was also provided with verbal instruction regarding how to report any failures or malfunctions of the orthosis and necessary follow up care. Patient was also instructed to contact our office regarding any change in status that may affect the function of the orthosis.  Patient demonstrated independence with proper donning, doffing, and fit and verbalized understanding of all instructions.  PLAN: Patient is to follow up in one week or as necessary (PRN). All questions were answered and concerns addressed. Plan of care was discussed with and agreed upon by the patient.

## 2021-03-13 ENCOUNTER — Other Ambulatory Visit: Payer: Self-pay | Admitting: Obstetrics and Gynecology

## 2021-03-13 DIAGNOSIS — Z1231 Encounter for screening mammogram for malignant neoplasm of breast: Secondary | ICD-10-CM

## 2021-04-11 DIAGNOSIS — Z20822 Contact with and (suspected) exposure to covid-19: Secondary | ICD-10-CM | POA: Diagnosis not present

## 2021-04-11 DIAGNOSIS — J029 Acute pharyngitis, unspecified: Secondary | ICD-10-CM | POA: Diagnosis not present

## 2021-04-17 ENCOUNTER — Ambulatory Visit
Admission: RE | Admit: 2021-04-17 | Discharge: 2021-04-17 | Disposition: A | Discharge status: Home / Self Care | Payer: BC Managed Care – PPO | Source: Ambulatory Visit | Attending: Obstetrics and Gynecology | Admitting: Obstetrics and Gynecology

## 2021-04-17 ENCOUNTER — Other Ambulatory Visit: Payer: Self-pay

## 2021-04-17 DIAGNOSIS — Z1231 Encounter for screening mammogram for malignant neoplasm of breast: Secondary | ICD-10-CM

## 2021-04-22 ENCOUNTER — Other Ambulatory Visit: Payer: Self-pay | Admitting: Obstetrics and Gynecology

## 2021-04-22 DIAGNOSIS — R928 Other abnormal and inconclusive findings on diagnostic imaging of breast: Secondary | ICD-10-CM

## 2021-04-22 DIAGNOSIS — Z01419 Encounter for gynecological examination (general) (routine) without abnormal findings: Secondary | ICD-10-CM | POA: Diagnosis not present

## 2021-04-30 ENCOUNTER — Other Ambulatory Visit: Payer: Self-pay | Admitting: Obstetrics and Gynecology

## 2021-04-30 DIAGNOSIS — E2839 Other primary ovarian failure: Secondary | ICD-10-CM

## 2021-05-06 ENCOUNTER — Other Ambulatory Visit: Payer: Self-pay | Admitting: Obstetrics and Gynecology

## 2021-05-06 ENCOUNTER — Ambulatory Visit
Admission: RE | Admit: 2021-05-06 | Discharge: 2021-05-06 | Disposition: A | Discharge status: Home / Self Care | Payer: BC Managed Care – PPO | Source: Ambulatory Visit | Attending: Obstetrics and Gynecology | Admitting: Obstetrics and Gynecology

## 2021-05-06 ENCOUNTER — Ambulatory Visit: Payer: BC Managed Care – PPO

## 2021-05-06 DIAGNOSIS — R928 Other abnormal and inconclusive findings on diagnostic imaging of breast: Secondary | ICD-10-CM

## 2021-05-06 DIAGNOSIS — R922 Inconclusive mammogram: Secondary | ICD-10-CM | POA: Diagnosis not present

## 2021-05-06 DIAGNOSIS — N6489 Other specified disorders of breast: Secondary | ICD-10-CM

## 2021-05-07 HISTORY — PX: BREAST BIOPSY: SHX20

## 2021-05-12 ENCOUNTER — Ambulatory Visit
Admission: RE | Admit: 2021-05-12 | Discharge: 2021-05-12 | Disposition: A | Discharge status: Home / Self Care | Payer: BC Managed Care – PPO | Source: Ambulatory Visit | Attending: Obstetrics and Gynecology | Admitting: Obstetrics and Gynecology

## 2021-05-12 ENCOUNTER — Other Ambulatory Visit: Payer: Self-pay | Admitting: Obstetrics and Gynecology

## 2021-05-12 DIAGNOSIS — E2839 Other primary ovarian failure: Secondary | ICD-10-CM

## 2021-05-12 DIAGNOSIS — R921 Mammographic calcification found on diagnostic imaging of breast: Secondary | ICD-10-CM | POA: Diagnosis not present

## 2021-05-12 DIAGNOSIS — N6011 Diffuse cystic mastopathy of right breast: Secondary | ICD-10-CM | POA: Diagnosis not present

## 2021-05-12 DIAGNOSIS — N6489 Other specified disorders of breast: Secondary | ICD-10-CM

## 2021-05-21 DIAGNOSIS — Z0000 Encounter for general adult medical examination without abnormal findings: Secondary | ICD-10-CM | POA: Diagnosis not present

## 2021-05-21 DIAGNOSIS — I519 Heart disease, unspecified: Secondary | ICD-10-CM | POA: Diagnosis not present

## 2021-05-21 DIAGNOSIS — E7800 Pure hypercholesterolemia, unspecified: Secondary | ICD-10-CM | POA: Diagnosis not present

## 2021-08-12 DIAGNOSIS — L814 Other melanin hyperpigmentation: Secondary | ICD-10-CM | POA: Diagnosis not present

## 2021-08-12 DIAGNOSIS — L578 Other skin changes due to chronic exposure to nonionizing radiation: Secondary | ICD-10-CM | POA: Diagnosis not present

## 2021-08-12 DIAGNOSIS — L821 Other seborrheic keratosis: Secondary | ICD-10-CM | POA: Diagnosis not present

## 2021-08-12 DIAGNOSIS — D225 Melanocytic nevi of trunk: Secondary | ICD-10-CM | POA: Diagnosis not present

## 2021-11-17 ENCOUNTER — Other Ambulatory Visit: Payer: Self-pay | Admitting: Obstetrics and Gynecology

## 2021-11-17 DIAGNOSIS — N6011 Diffuse cystic mastopathy of right breast: Secondary | ICD-10-CM

## 2021-12-05 DIAGNOSIS — M19071 Primary osteoarthritis, right ankle and foot: Secondary | ICD-10-CM | POA: Diagnosis not present

## 2021-12-15 ENCOUNTER — Other Ambulatory Visit: Payer: Self-pay | Admitting: Obstetrics and Gynecology

## 2021-12-15 ENCOUNTER — Ambulatory Visit: Payer: BC Managed Care – PPO

## 2021-12-15 ENCOUNTER — Ambulatory Visit
Admission: RE | Admit: 2021-12-15 | Discharge: 2021-12-15 | Disposition: A | Discharge status: Home / Self Care | Payer: PPO | Source: Ambulatory Visit | Attending: Obstetrics and Gynecology | Admitting: Obstetrics and Gynecology

## 2021-12-15 DIAGNOSIS — N6011 Diffuse cystic mastopathy of right breast: Secondary | ICD-10-CM

## 2021-12-15 DIAGNOSIS — R922 Inconclusive mammogram: Secondary | ICD-10-CM | POA: Diagnosis not present

## 2021-12-15 DIAGNOSIS — R921 Mammographic calcification found on diagnostic imaging of breast: Secondary | ICD-10-CM | POA: Diagnosis not present

## 2021-12-19 DIAGNOSIS — Z7189 Other specified counseling: Secondary | ICD-10-CM | POA: Diagnosis not present

## 2021-12-19 DIAGNOSIS — G4700 Insomnia, unspecified: Secondary | ICD-10-CM | POA: Diagnosis not present

## 2021-12-19 DIAGNOSIS — G4725 Circadian rhythm sleep disorder, jet lag type: Secondary | ICD-10-CM | POA: Diagnosis not present

## 2021-12-19 DIAGNOSIS — Z23 Encounter for immunization: Secondary | ICD-10-CM | POA: Diagnosis not present

## 2022-04-28 DIAGNOSIS — Z124 Encounter for screening for malignant neoplasm of cervix: Secondary | ICD-10-CM | POA: Diagnosis not present

## 2022-04-28 DIAGNOSIS — Z01419 Encounter for gynecological examination (general) (routine) without abnormal findings: Secondary | ICD-10-CM | POA: Diagnosis not present

## 2022-06-03 DIAGNOSIS — M8580 Other specified disorders of bone density and structure, unspecified site: Secondary | ICD-10-CM | POA: Diagnosis not present

## 2022-06-03 DIAGNOSIS — Z1159 Encounter for screening for other viral diseases: Secondary | ICD-10-CM | POA: Diagnosis not present

## 2022-06-03 DIAGNOSIS — Z23 Encounter for immunization: Secondary | ICD-10-CM | POA: Diagnosis not present

## 2022-06-03 DIAGNOSIS — Z9181 History of falling: Secondary | ICD-10-CM | POA: Diagnosis not present

## 2022-06-03 DIAGNOSIS — Z0000 Encounter for general adult medical examination without abnormal findings: Secondary | ICD-10-CM | POA: Diagnosis not present

## 2022-06-03 DIAGNOSIS — I471 Supraventricular tachycardia, unspecified: Secondary | ICD-10-CM | POA: Diagnosis not present

## 2022-06-03 DIAGNOSIS — I519 Heart disease, unspecified: Secondary | ICD-10-CM | POA: Diagnosis not present

## 2022-06-03 DIAGNOSIS — E7800 Pure hypercholesterolemia, unspecified: Secondary | ICD-10-CM | POA: Diagnosis not present

## 2022-06-16 ENCOUNTER — Ambulatory Visit
Admission: RE | Admit: 2022-06-16 | Discharge: 2022-06-16 | Disposition: A | Discharge status: Home / Self Care | Payer: PPO | Source: Ambulatory Visit | Attending: Obstetrics and Gynecology | Admitting: Obstetrics and Gynecology

## 2022-06-16 DIAGNOSIS — N6011 Diffuse cystic mastopathy of right breast: Secondary | ICD-10-CM

## 2022-06-16 DIAGNOSIS — R921 Mammographic calcification found on diagnostic imaging of breast: Secondary | ICD-10-CM | POA: Diagnosis not present

## 2022-07-21 DIAGNOSIS — H524 Presbyopia: Secondary | ICD-10-CM | POA: Diagnosis not present

## 2022-07-21 DIAGNOSIS — H2511 Age-related nuclear cataract, right eye: Secondary | ICD-10-CM | POA: Diagnosis not present

## 2022-07-21 DIAGNOSIS — H43813 Vitreous degeneration, bilateral: Secondary | ICD-10-CM | POA: Diagnosis not present

## 2022-07-21 DIAGNOSIS — D3131 Benign neoplasm of right choroid: Secondary | ICD-10-CM | POA: Diagnosis not present

## 2022-07-21 DIAGNOSIS — H25812 Combined forms of age-related cataract, left eye: Secondary | ICD-10-CM | POA: Diagnosis not present

## 2022-07-21 DIAGNOSIS — H5203 Hypermetropia, bilateral: Secondary | ICD-10-CM | POA: Diagnosis not present

## 2022-07-21 DIAGNOSIS — H52223 Regular astigmatism, bilateral: Secondary | ICD-10-CM | POA: Diagnosis not present

## 2022-08-18 DIAGNOSIS — D225 Melanocytic nevi of trunk: Secondary | ICD-10-CM | POA: Diagnosis not present

## 2022-08-18 DIAGNOSIS — L570 Actinic keratosis: Secondary | ICD-10-CM | POA: Diagnosis not present

## 2022-08-18 DIAGNOSIS — L814 Other melanin hyperpigmentation: Secondary | ICD-10-CM | POA: Diagnosis not present

## 2022-08-18 DIAGNOSIS — L578 Other skin changes due to chronic exposure to nonionizing radiation: Secondary | ICD-10-CM | POA: Diagnosis not present

## 2022-08-18 DIAGNOSIS — D239 Other benign neoplasm of skin, unspecified: Secondary | ICD-10-CM | POA: Diagnosis not present

## 2022-08-18 DIAGNOSIS — L821 Other seborrheic keratosis: Secondary | ICD-10-CM | POA: Diagnosis not present

## 2022-11-05 DIAGNOSIS — D2239 Melanocytic nevi of other parts of face: Secondary | ICD-10-CM | POA: Diagnosis not present

## 2022-11-05 DIAGNOSIS — B078 Other viral warts: Secondary | ICD-10-CM | POA: Diagnosis not present

## 2022-11-05 DIAGNOSIS — Z872 Personal history of diseases of the skin and subcutaneous tissue: Secondary | ICD-10-CM | POA: Diagnosis not present

## 2022-12-08 DIAGNOSIS — D2239 Melanocytic nevi of other parts of face: Secondary | ICD-10-CM | POA: Diagnosis not present

## 2022-12-08 DIAGNOSIS — B078 Other viral warts: Secondary | ICD-10-CM | POA: Diagnosis not present

## 2023-02-02 DIAGNOSIS — M1612 Unilateral primary osteoarthritis, left hip: Secondary | ICD-10-CM | POA: Diagnosis not present

## 2023-02-02 DIAGNOSIS — S39012A Strain of muscle, fascia and tendon of lower back, initial encounter: Secondary | ICD-10-CM | POA: Diagnosis not present

## 2023-05-07 IMAGING — MG MM DIGITAL SCREENING BILAT W/ TOMO AND CAD
6 of 12 series · 6 of 36 positions shown · non-contrast
Comparison: Previous exam(s).

CLINICAL DATA: Screening.

EXAM:
DIGITAL SCREENING BILATERAL MAMMOGRAM WITH TOMOSYNTHESIS AND CAD
TECHNIQUE: Bilateral screening digital craniocaudal and mediolateral oblique
mammograms were obtained. Bilateral screening digital breast
tomosynthesis was performed. The images were evaluated with
computer-aided detection.

[R CC synth-2D (1 of 2)]
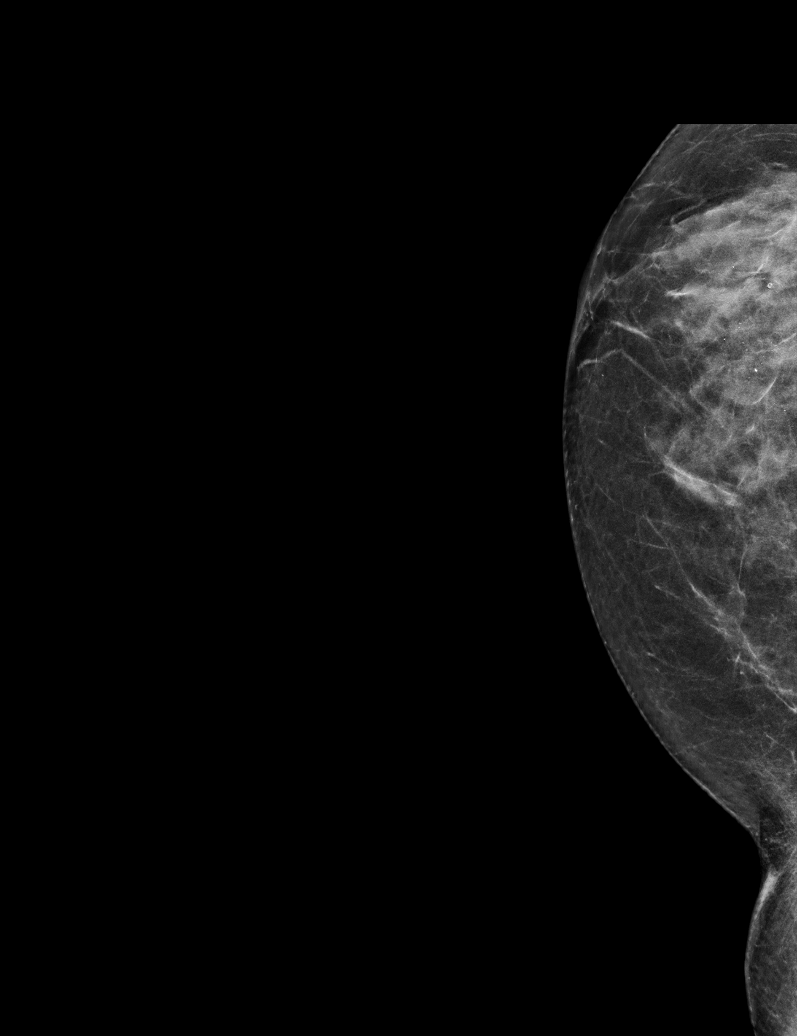

[L MLO synth-2D (1 of 2)]
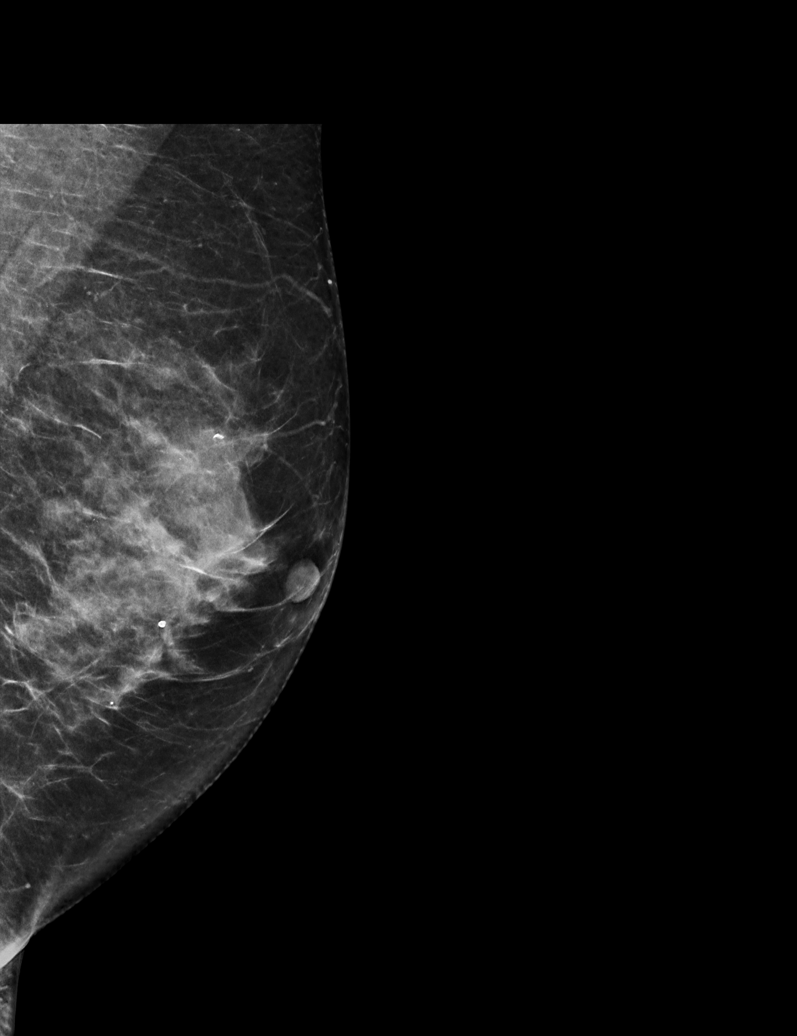

[R CC synth-2D (2 of 2)]
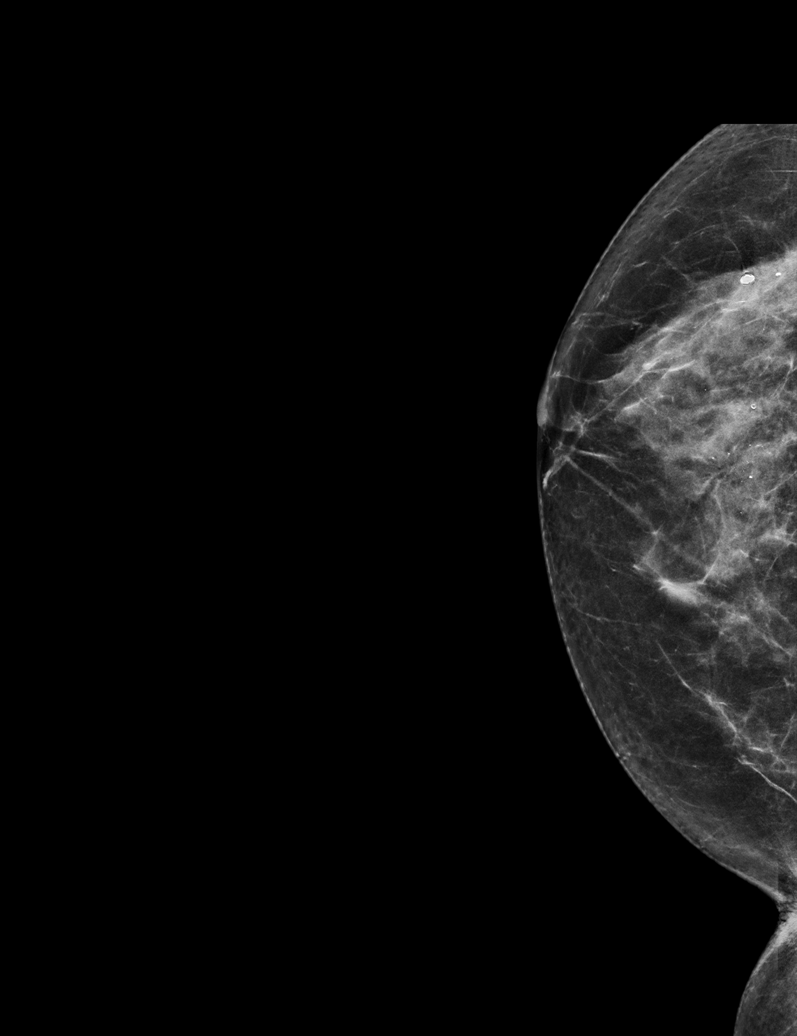

[L MLO synth-2D (2 of 2)]
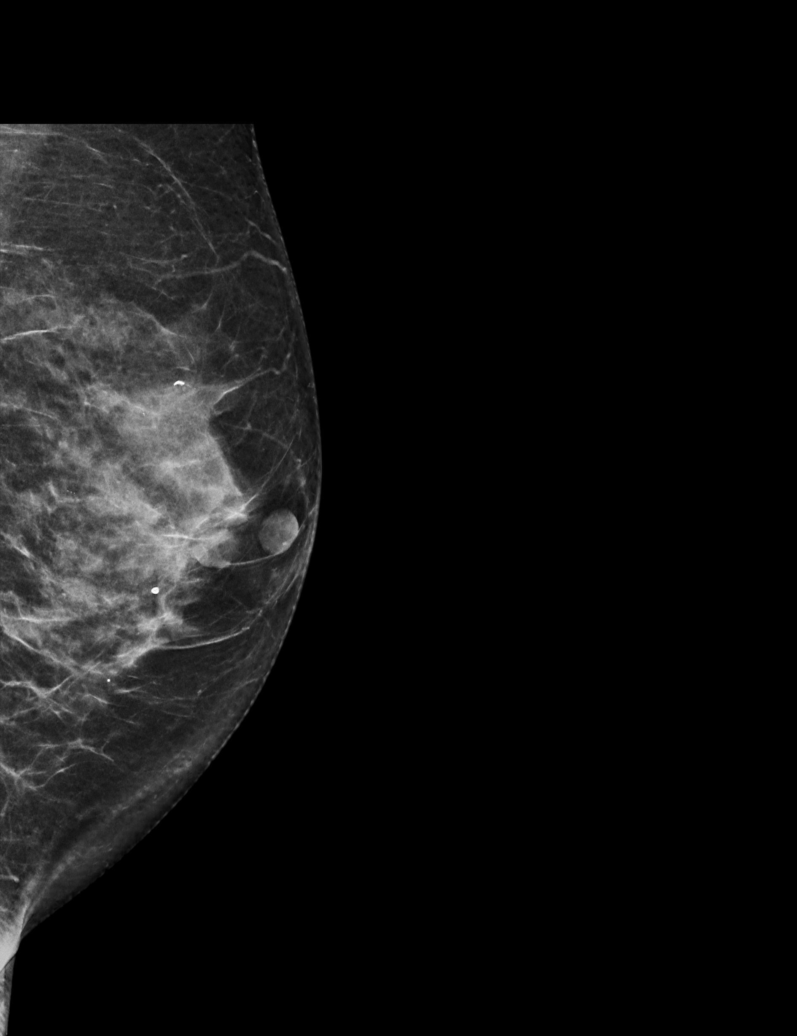

[R MLO synth-2D]
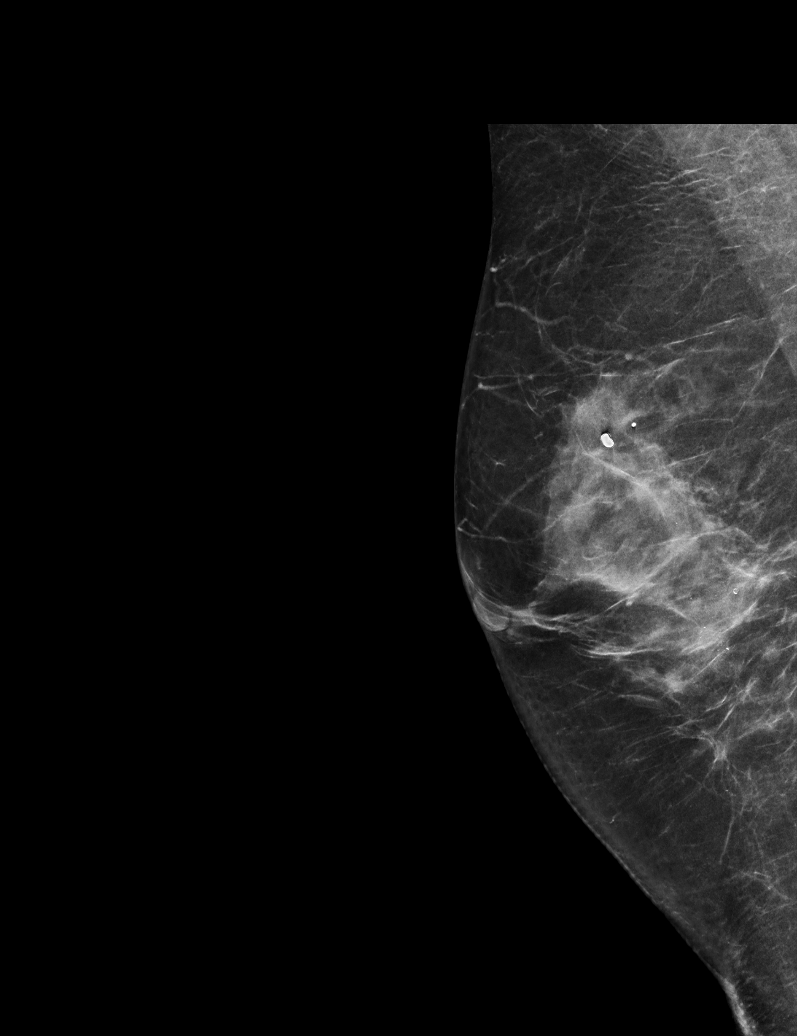

[L CC synth-2D]
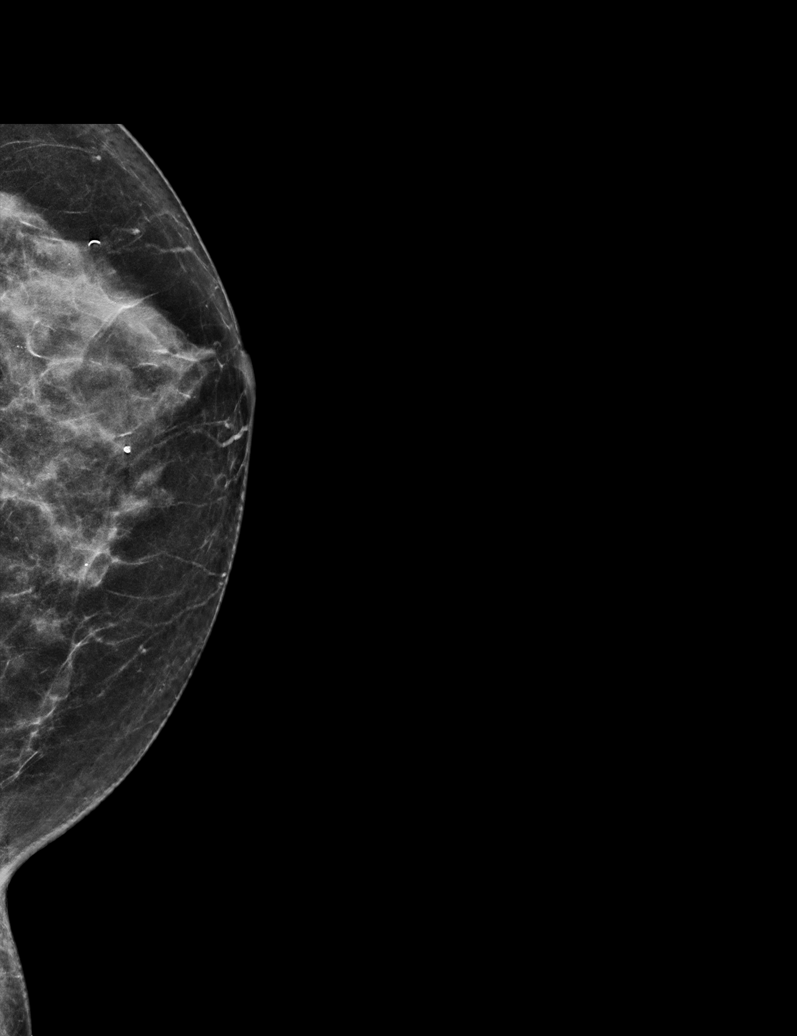

[6 of 36 positions shown; findings below may reference images not displayed]

ACR Breast Density Category c: The breast tissue is heterogeneously
dense, which may obscure small masses.
FINDINGS: In the right breast possible distortion requires further evaluation.

In the left breast a group of calcifications requires further
evaluation.
IMPRESSION: Further evaluation is suggested for possible distortion in the right
breast.

Further evaluation is suggested for possible calcifications in the
left breast.

RECOMMENDATION:
Diagnostic mammogram and possibly ultrasound of both breasts.
(Code:JJ-A-SSO)

The patient will be contacted regarding the findings, and additional
imaging will be scheduled.

BI-RADS CATEGORY  0: Incomplete. Need additional imaging evaluation
and/or prior mammograms for comparison.

## 2023-06-10 DIAGNOSIS — A084 Viral intestinal infection, unspecified: Secondary | ICD-10-CM | POA: Diagnosis not present

## 2023-06-11 DIAGNOSIS — A084 Viral intestinal infection, unspecified: Secondary | ICD-10-CM | POA: Diagnosis not present

## 2023-06-15 DIAGNOSIS — R197 Diarrhea, unspecified: Secondary | ICD-10-CM | POA: Diagnosis not present

## 2023-06-15 DIAGNOSIS — Z0000 Encounter for general adult medical examination without abnormal findings: Secondary | ICD-10-CM | POA: Diagnosis not present

## 2023-06-15 DIAGNOSIS — I519 Heart disease, unspecified: Secondary | ICD-10-CM | POA: Diagnosis not present

## 2023-06-15 DIAGNOSIS — G4700 Insomnia, unspecified: Secondary | ICD-10-CM | POA: Diagnosis not present

## 2023-06-15 DIAGNOSIS — I471 Supraventricular tachycardia, unspecified: Secondary | ICD-10-CM | POA: Diagnosis not present

## 2023-06-15 DIAGNOSIS — E7800 Pure hypercholesterolemia, unspecified: Secondary | ICD-10-CM | POA: Diagnosis not present

## 2023-06-15 DIAGNOSIS — M8580 Other specified disorders of bone density and structure, unspecified site: Secondary | ICD-10-CM | POA: Diagnosis not present

## 2023-06-16 ENCOUNTER — Other Ambulatory Visit: Payer: Self-pay | Admitting: Obstetrics and Gynecology

## 2023-06-16 DIAGNOSIS — E2839 Other primary ovarian failure: Secondary | ICD-10-CM

## 2023-06-18 ENCOUNTER — Other Ambulatory Visit

## 2023-06-28 DIAGNOSIS — Z1231 Encounter for screening mammogram for malignant neoplasm of breast: Secondary | ICD-10-CM | POA: Diagnosis not present

## 2023-07-01 ENCOUNTER — Ambulatory Visit
Admission: RE | Admit: 2023-07-01 | Discharge: 2023-07-01 | Disposition: A | Discharge status: Home / Self Care | Source: Ambulatory Visit | Attending: Obstetrics and Gynecology | Admitting: Obstetrics and Gynecology

## 2023-07-01 DIAGNOSIS — M8588 Other specified disorders of bone density and structure, other site: Secondary | ICD-10-CM | POA: Diagnosis not present

## 2023-07-01 DIAGNOSIS — E2839 Other primary ovarian failure: Secondary | ICD-10-CM

## 2023-07-01 DIAGNOSIS — N958 Other specified menopausal and perimenopausal disorders: Secondary | ICD-10-CM | POA: Diagnosis not present

## 2023-07-27 DIAGNOSIS — H524 Presbyopia: Secondary | ICD-10-CM | POA: Diagnosis not present

## 2023-07-27 DIAGNOSIS — H5203 Hypermetropia, bilateral: Secondary | ICD-10-CM | POA: Diagnosis not present

## 2023-07-27 DIAGNOSIS — H25812 Combined forms of age-related cataract, left eye: Secondary | ICD-10-CM | POA: Diagnosis not present

## 2023-07-27 DIAGNOSIS — H52223 Regular astigmatism, bilateral: Secondary | ICD-10-CM | POA: Diagnosis not present

## 2023-08-29 DIAGNOSIS — J988 Other specified respiratory disorders: Secondary | ICD-10-CM | POA: Diagnosis not present

## 2023-09-27 DIAGNOSIS — L578 Other skin changes due to chronic exposure to nonionizing radiation: Secondary | ICD-10-CM | POA: Diagnosis not present

## 2023-09-27 DIAGNOSIS — L814 Other melanin hyperpigmentation: Secondary | ICD-10-CM | POA: Diagnosis not present

## 2023-09-27 DIAGNOSIS — D239 Other benign neoplasm of skin, unspecified: Secondary | ICD-10-CM | POA: Diagnosis not present

## 2023-09-27 DIAGNOSIS — L821 Other seborrheic keratosis: Secondary | ICD-10-CM | POA: Diagnosis not present

## 2023-09-27 DIAGNOSIS — D225 Melanocytic nevi of trunk: Secondary | ICD-10-CM | POA: Diagnosis not present

## 2023-10-04 DIAGNOSIS — E7800 Pure hypercholesterolemia, unspecified: Secondary | ICD-10-CM | POA: Diagnosis not present

## 2023-10-21 DIAGNOSIS — F331 Major depressive disorder, recurrent, moderate: Secondary | ICD-10-CM | POA: Diagnosis not present

## 2023-11-01 DIAGNOSIS — F331 Major depressive disorder, recurrent, moderate: Secondary | ICD-10-CM | POA: Diagnosis not present

## 2023-11-15 DIAGNOSIS — F331 Major depressive disorder, recurrent, moderate: Secondary | ICD-10-CM | POA: Diagnosis not present

## 2023-11-29 DIAGNOSIS — F331 Major depressive disorder, recurrent, moderate: Secondary | ICD-10-CM | POA: Diagnosis not present

## 2023-12-20 DIAGNOSIS — H25812 Combined forms of age-related cataract, left eye: Secondary | ICD-10-CM | POA: Diagnosis not present

## 2023-12-20 DIAGNOSIS — H2511 Age-related nuclear cataract, right eye: Secondary | ICD-10-CM | POA: Diagnosis not present

## 2023-12-20 DIAGNOSIS — H16223 Keratoconjunctivitis sicca, not specified as Sjogren's, bilateral: Secondary | ICD-10-CM | POA: Diagnosis not present

## 2024-02-08 DIAGNOSIS — H16223 Keratoconjunctivitis sicca, not specified as Sjogren's, bilateral: Secondary | ICD-10-CM | POA: Diagnosis not present

## 2024-02-17 ENCOUNTER — Other Ambulatory Visit
